# Patient Record
Sex: Female | Born: 1950 | Race: Black or African American | Hispanic: No | Marital: Married | Smoking: Never smoker
Health system: Southern US, Community
[De-identification: ages and names within clinical notes are randomized; demographics above are authoritative.]

## PROBLEM LIST (undated history)

## (undated) DIAGNOSIS — F329 Major depressive disorder, single episode, unspecified: Secondary | ICD-10-CM

## (undated) DIAGNOSIS — D649 Anemia, unspecified: Secondary | ICD-10-CM

## (undated) DIAGNOSIS — G4730 Sleep apnea, unspecified: Secondary | ICD-10-CM

## (undated) DIAGNOSIS — K219 Gastro-esophageal reflux disease without esophagitis: Secondary | ICD-10-CM

## (undated) DIAGNOSIS — R519 Headache, unspecified: Secondary | ICD-10-CM

## (undated) DIAGNOSIS — K760 Fatty (change of) liver, not elsewhere classified: Secondary | ICD-10-CM

## (undated) DIAGNOSIS — F32A Depression, unspecified: Secondary | ICD-10-CM

## (undated) HISTORY — DX: Major depressive disorder, single episode, unspecified: F32.9

## (undated) HISTORY — PX: ABDOMINAL HYSTERECTOMY: SHX81

## (undated) HISTORY — DX: Depression, unspecified: F32.A

---

## 1999-04-17 ENCOUNTER — Other Ambulatory Visit: Admission: RE | Admit: 1999-04-17 | Discharge: 1999-04-17 | Payer: Self-pay | Admitting: Gynecology

## 1999-06-03 ENCOUNTER — Encounter: Admission: RE | Admit: 1999-06-03 | Discharge: 1999-09-01 | Payer: Self-pay | Admitting: Internal Medicine

## 1999-08-18 ENCOUNTER — Emergency Department (HOSPITAL_COMMUNITY): Admission: EM | Admit: 1999-08-18 | Discharge: 1999-08-18 | Payer: Self-pay | Admitting: Internal Medicine

## 1999-10-14 ENCOUNTER — Encounter: Admission: RE | Admit: 1999-10-14 | Discharge: 2000-01-12 | Payer: Self-pay | Admitting: Internal Medicine

## 2000-04-20 ENCOUNTER — Other Ambulatory Visit: Admission: RE | Admit: 2000-04-20 | Discharge: 2000-04-20 | Payer: Self-pay | Admitting: Gynecology

## 2000-05-10 ENCOUNTER — Encounter: Admission: RE | Admit: 2000-05-10 | Discharge: 2000-08-08 | Payer: Self-pay | Admitting: Internal Medicine

## 2000-08-23 ENCOUNTER — Encounter: Payer: Self-pay | Admitting: Internal Medicine

## 2000-08-23 ENCOUNTER — Encounter: Admission: RE | Admit: 2000-08-23 | Discharge: 2000-11-21 | Payer: Self-pay | Admitting: Internal Medicine

## 2000-08-23 ENCOUNTER — Encounter: Admission: RE | Admit: 2000-08-23 | Discharge: 2000-08-23 | Payer: Self-pay | Admitting: Internal Medicine

## 2001-04-11 ENCOUNTER — Other Ambulatory Visit: Admission: RE | Admit: 2001-04-11 | Discharge: 2001-04-11 | Payer: Self-pay | Admitting: Gynecology

## 2001-08-30 ENCOUNTER — Encounter: Admission: RE | Admit: 2001-08-30 | Discharge: 2001-11-28 | Payer: Self-pay | Admitting: Internal Medicine

## 2001-11-30 ENCOUNTER — Encounter: Admission: RE | Admit: 2001-11-30 | Discharge: 2002-02-28 | Payer: Self-pay | Admitting: Internal Medicine

## 2002-04-24 ENCOUNTER — Other Ambulatory Visit: Admission: RE | Admit: 2002-04-24 | Discharge: 2002-04-24 | Payer: Self-pay | Admitting: Gynecology

## 2002-04-24 ENCOUNTER — Encounter: Admission: RE | Admit: 2002-04-24 | Discharge: 2002-07-23 | Payer: Self-pay | Admitting: Internal Medicine

## 2002-11-17 ENCOUNTER — Encounter: Admission: RE | Admit: 2002-11-17 | Discharge: 2003-02-15 | Payer: Self-pay | Admitting: Internal Medicine

## 2003-02-23 ENCOUNTER — Encounter: Admission: RE | Admit: 2003-02-23 | Discharge: 2003-05-24 | Payer: Self-pay | Admitting: Internal Medicine

## 2003-03-12 ENCOUNTER — Other Ambulatory Visit: Admission: RE | Admit: 2003-03-12 | Discharge: 2003-03-12 | Payer: Self-pay | Admitting: Gynecology

## 2003-03-19 ENCOUNTER — Encounter: Admission: RE | Admit: 2003-03-19 | Discharge: 2003-06-17 | Payer: Self-pay | Admitting: Internal Medicine

## 2004-03-11 ENCOUNTER — Encounter: Admission: RE | Admit: 2004-03-11 | Discharge: 2004-06-09 | Payer: Self-pay | Admitting: Internal Medicine

## 2004-03-31 ENCOUNTER — Other Ambulatory Visit: Admission: RE | Admit: 2004-03-31 | Discharge: 2004-03-31 | Payer: Self-pay | Admitting: Gynecology

## 2004-09-03 ENCOUNTER — Encounter: Admission: RE | Admit: 2004-09-03 | Discharge: 2004-09-03 | Payer: Self-pay | Admitting: Internal Medicine

## 2004-09-30 ENCOUNTER — Ambulatory Visit: Payer: Self-pay | Admitting: Gastroenterology

## 2004-10-07 ENCOUNTER — Encounter: Admission: RE | Admit: 2004-10-07 | Discharge: 2005-01-05 | Payer: Self-pay | Admitting: Internal Medicine

## 2005-01-09 ENCOUNTER — Encounter: Admission: RE | Admit: 2005-01-09 | Discharge: 2005-04-09 | Payer: Self-pay | Admitting: Internal Medicine

## 2005-04-13 ENCOUNTER — Other Ambulatory Visit: Admission: RE | Admit: 2005-04-13 | Discharge: 2005-04-13 | Payer: Self-pay | Admitting: Gynecology

## 2005-10-05 HISTORY — PX: KNEE SURGERY: SHX244

## 2005-11-03 ENCOUNTER — Other Ambulatory Visit: Admission: RE | Admit: 2005-11-03 | Discharge: 2005-11-03 | Payer: Self-pay | Admitting: Gynecology

## 2005-12-01 ENCOUNTER — Ambulatory Visit: Payer: Self-pay | Admitting: Internal Medicine

## 2006-03-19 ENCOUNTER — Ambulatory Visit: Payer: Self-pay | Admitting: Internal Medicine

## 2006-03-25 ENCOUNTER — Other Ambulatory Visit: Admission: RE | Admit: 2006-03-25 | Discharge: 2006-03-25 | Payer: Self-pay | Admitting: Gynecology

## 2006-04-03 ENCOUNTER — Ambulatory Visit: Payer: Self-pay | Admitting: Internal Medicine

## 2006-08-18 ENCOUNTER — Other Ambulatory Visit: Admission: RE | Admit: 2006-08-18 | Discharge: 2006-08-18 | Payer: Self-pay | Admitting: Gynecology

## 2006-09-20 ENCOUNTER — Ambulatory Visit: Payer: Self-pay | Admitting: Internal Medicine

## 2006-10-28 ENCOUNTER — Ambulatory Visit: Payer: Self-pay | Admitting: Unknown Physician Specialty

## 2006-11-15 ENCOUNTER — Ambulatory Visit: Payer: Self-pay | Admitting: Unknown Physician Specialty

## 2006-11-22 ENCOUNTER — Ambulatory Visit: Payer: Self-pay | Admitting: Internal Medicine

## 2007-01-04 ENCOUNTER — Other Ambulatory Visit: Admission: RE | Admit: 2007-01-04 | Discharge: 2007-01-04 | Payer: Self-pay | Admitting: Gynecology

## 2007-08-10 ENCOUNTER — Other Ambulatory Visit: Admission: RE | Admit: 2007-08-10 | Discharge: 2007-08-10 | Payer: Self-pay | Admitting: Gynecology

## 2007-10-24 ENCOUNTER — Ambulatory Visit: Payer: Self-pay | Admitting: Gastroenterology

## 2008-04-26 ENCOUNTER — Ambulatory Visit: Payer: Self-pay | Admitting: Gastroenterology

## 2008-05-08 ENCOUNTER — Ambulatory Visit: Payer: Self-pay | Admitting: Gastroenterology

## 2008-09-26 ENCOUNTER — Emergency Department: Payer: Self-pay | Admitting: Emergency Medicine

## 2008-10-26 ENCOUNTER — Other Ambulatory Visit: Admission: RE | Admit: 2008-10-26 | Discharge: 2008-10-26 | Payer: Self-pay | Admitting: Gynecology

## 2010-01-21 ENCOUNTER — Ambulatory Visit: Payer: Self-pay | Admitting: Internal Medicine

## 2010-10-27 ENCOUNTER — Ambulatory Visit: Payer: Self-pay | Admitting: Gastroenterology

## 2011-01-29 ENCOUNTER — Ambulatory Visit: Payer: Self-pay | Admitting: Gastroenterology

## 2011-03-27 ENCOUNTER — Emergency Department (HOSPITAL_COMMUNITY)
Admission: EM | Admit: 2011-03-27 | Discharge: 2011-03-27 | Disposition: A | Discharge status: Home / Self Care | Payer: Worker's Compensation | Attending: Emergency Medicine | Admitting: Emergency Medicine

## 2011-03-27 ENCOUNTER — Emergency Department (HOSPITAL_COMMUNITY): Payer: Worker's Compensation

## 2011-03-27 DIAGNOSIS — F329 Major depressive disorder, single episode, unspecified: Secondary | ICD-10-CM | POA: Insufficient documentation

## 2011-03-27 DIAGNOSIS — E119 Type 2 diabetes mellitus without complications: Secondary | ICD-10-CM | POA: Insufficient documentation

## 2011-03-27 DIAGNOSIS — Z79899 Other long term (current) drug therapy: Secondary | ICD-10-CM | POA: Insufficient documentation

## 2011-03-27 DIAGNOSIS — M542 Cervicalgia: Secondary | ICD-10-CM | POA: Insufficient documentation

## 2011-03-27 DIAGNOSIS — S139XXA Sprain of joints and ligaments of unspecified parts of neck, initial encounter: Secondary | ICD-10-CM | POA: Insufficient documentation

## 2011-03-27 DIAGNOSIS — W108XXA Fall (on) (from) other stairs and steps, initial encounter: Secondary | ICD-10-CM | POA: Insufficient documentation

## 2011-03-27 DIAGNOSIS — F3289 Other specified depressive episodes: Secondary | ICD-10-CM | POA: Insufficient documentation

## 2011-03-27 DIAGNOSIS — K219 Gastro-esophageal reflux disease without esophagitis: Secondary | ICD-10-CM | POA: Insufficient documentation

## 2011-04-14 ENCOUNTER — Other Ambulatory Visit: Payer: Self-pay | Admitting: Neurological Surgery

## 2011-04-14 DIAGNOSIS — R200 Anesthesia of skin: Secondary | ICD-10-CM

## 2011-04-16 ENCOUNTER — Ambulatory Visit
Admission: RE | Admit: 2011-04-16 | Discharge: 2011-04-16 | Disposition: A | Discharge status: Home / Self Care | Payer: Worker's Compensation | Source: Ambulatory Visit | Attending: Neurological Surgery | Admitting: Neurological Surgery

## 2011-04-16 DIAGNOSIS — R200 Anesthesia of skin: Secondary | ICD-10-CM

## 2011-04-23 ENCOUNTER — Ambulatory Visit (HOSPITAL_COMMUNITY)
Admission: RE | Admit: 2011-04-23 | Discharge: 2011-04-23 | Disposition: A | Discharge status: Home / Self Care | Payer: Worker's Compensation | Source: Ambulatory Visit | Attending: Neurology | Admitting: Neurology

## 2011-04-23 DIAGNOSIS — Z1389 Encounter for screening for other disorder: Secondary | ICD-10-CM | POA: Insufficient documentation

## 2011-04-23 DIAGNOSIS — R51 Headache: Secondary | ICD-10-CM | POA: Insufficient documentation

## 2011-04-23 NOTE — Procedures (Signed)
HISTORY:  A 60 year old female, status post fall with head injury now with complaints of headache.  MEDICATIONS:  Metformin, valacyclovir, citalopram, glipizide, bupropion and ketoprofen.  CONDITIONS OF RECORDING:  This is a 16-channel EEG carried out with patient in the awake state.  DESCRIPTION:  The waking background activity consists of a low-voltage symmetrical fairly well-organized 9 Hz alpha activity seen from the parieto-occipital posterotemporal regions.  Low-voltage fast activity poorly organized was seen and during at times superimposed on more posterior rhythms.  A mixture of theta and beta was seen from the central and temporal regions.  The patient does not drowse asleep. Hypoventilation was performed and produced a mild build up, but failed to elicit any abnormalities.  Intermittent photic stimulation was performed and this is symmetrical driving response, but failed to elicit any abnormalities.  IMPRESSION:  This is a normal awake EEG.  COMMENT:  An EEG with the patient is sleep deprived to elicit drowse and light sleep maybe desirable to further elicit a possible seizure disorder.          ______________________________ Thana Farr, MD    BJ:YNWG D:  04/23/2011 17:48:36  T:  04/23/2011 23:34:49  Job #:  [id number]

## 2011-05-06 ENCOUNTER — Other Ambulatory Visit: Payer: Self-pay | Admitting: Gynecology

## 2011-05-06 DIAGNOSIS — R928 Other abnormal and inconclusive findings on diagnostic imaging of breast: Secondary | ICD-10-CM

## 2011-05-13 ENCOUNTER — Ambulatory Visit
Admission: RE | Admit: 2011-05-13 | Discharge: 2011-05-13 | Disposition: A | Discharge status: Home / Self Care | Payer: Worker's Compensation | Source: Ambulatory Visit | Attending: Gynecology | Admitting: Gynecology

## 2011-05-13 DIAGNOSIS — R928 Other abnormal and inconclusive findings on diagnostic imaging of breast: Secondary | ICD-10-CM

## 2011-11-12 ENCOUNTER — Ambulatory Visit (INDEPENDENT_AMBULATORY_CARE_PROVIDER_SITE_OTHER): Payer: 59 | Admitting: Family Medicine

## 2011-11-12 ENCOUNTER — Ambulatory Visit: Payer: 59

## 2011-11-12 DIAGNOSIS — M25569 Pain in unspecified knee: Secondary | ICD-10-CM

## 2011-11-12 DIAGNOSIS — E669 Obesity, unspecified: Secondary | ICD-10-CM

## 2011-11-12 DIAGNOSIS — E119 Type 2 diabetes mellitus without complications: Secondary | ICD-10-CM | POA: Insufficient documentation

## 2011-11-12 NOTE — Progress Notes (Signed)
  Patient Name: Jasmine Robinson Date of Birth: 1951/01/23 Medical Record Number: [id number] Gender: female Date of Encounter: 11/12/2011  History of Present Illness:  Jasmine Robinson is a 61 y.o. very pleasant female patient who presents with the following:  History of diabetes, depression and anxiety.  Had a concussion last year which had long lasting effects.  She is much better now but still follows-up with Dr. Vela Prose.  Also fell last year and hurt her right knee- she was noted to have a bone island on plain film and 6 month follow- up was recommended.  She is here to have this done today. Knee hurts "like a toothache."  Hurts most when she gets up from rest.  Has gotten a lot worse the last couple of weeks but no known re-injury.  Has had a meniscal tear repair in her left knee per [facility] in [place] in aprox 2007.    Right knee has not swollen. No clicking of popping but does "get stuck."  No instability.    Patient Active Problem List  Diagnoses  . Diabetes mellitus  . Obesity   Past Medical History  Diagnosis Date  . Diabetes mellitus   . Depression    Past Surgical History  Procedure Date  . Abdominal hysterectomy   . Knee surgery 2007    torn meniscus   History  Substance Use Topics  . Smoking status: Never Smoker   . Smokeless tobacco: Never Used  . Alcohol Use: Yes     occasionally   Family History  Problem Relation Age of Onset  . Diabetes Father    No Known Allergies  Medication list has been reviewed and updated.  Review of Systems: As per HPI, otherwise negative  Physical Examination: Filed Vitals:   11/12/11 1854  BP: 154/70  Pulse: 112  Temp: 98.6 F (37 C)  TempSrc: Oral  Resp: 18  Height: 5' 6.25" (1.683 m)  Weight: 222 lb 9.6 oz (100.971 kg)    Body mass index is 35.66 kg/(m^2).  GEN: WDWN, NAD, Non-toxic, A & O x 3, obese HEENT: Atraumatic, Normocephalic. Neck supple. No masses, No LAD. Ears and Nose: No external  deformity. CV: RRR, No M/G/R. No JVD. No thrill. No extra heart sounds. PULM: CTA B, no wheezes, crackles, rhonchi. No retractions. No resp. distress. No accessory muscle use. Rt knee: plus crepitus, tender at medical joint line.  Negative anterior drawer.  No swelling, redness or heat.   EXTR: No c/c/e NEURO Normal gait.  PSYCH: Normally interactive. Conversant. Not depressed or anxious appearing.  Calm demeanor.   [facility] reading (PRIMARY) by  Dr. Patsy Lager.  Knee negative for fracture- bone islands in tibia, mild to moderate degenerative change and spurring  Assessment and Plan: 61 year old female with knee pain- likely due to degenerative changes.  She will call and schedule a visit at [facility].  She will continue to use aleve prn for her knee pain.  Also continue regular follow- up of her diabetes per her PCP.  Let us know if we can help in any way, await formal over-read of her film

## 2011-11-13 ENCOUNTER — Other Ambulatory Visit: Payer: Self-pay | Admitting: Family Medicine

## 2011-11-16 ENCOUNTER — Telehealth: Payer: Self-pay | Admitting: Family Medicine

## 2011-11-16 NOTE — Telephone Encounter (Signed)
Message copied by Kerney Elbe on Mon Nov 16, 2011  4:33 PM ------      Message from: Abbe Amsterdam C      Created: Mon Nov 16, 2011  3:53 PM       Please pull chart for me!  Thanks      JC            Need to ask rads to compare with old films re: bone [place]

## 2011-11-18 ENCOUNTER — Encounter: Payer: Self-pay | Admitting: Family Medicine

## 2011-12-01 DIAGNOSIS — F4323 Adjustment disorder with mixed anxiety and depressed mood: Secondary | ICD-10-CM

## 2012-05-03 ENCOUNTER — Observation Stay: Payer: Self-pay | Admitting: Internal Medicine

## 2012-05-03 LAB — TSH: Thyroid Stimulating Horm: 0.652 u[IU]/mL

## 2012-05-03 LAB — BASIC METABOLIC PANEL
BUN: 18 mg/dL (ref 7–18)
Creatinine: 0.93 mg/dL (ref 0.60–1.30)
EGFR (African American): >60
EGFR (Non-African Amer.): >60
Glucose: 166 mg/dL — ABNORMAL HIGH (ref 65–99)
Osmolality: 281 (ref 275–301)
Sodium: 138 mmol/L (ref 136–145)

## 2012-05-03 LAB — HEPATIC FUNCTION PANEL A (ARMC)
Bilirubin, Direct: <0.1 mg/dL (ref 0.00–0.20)
Bilirubin,Total: 0.2 mg/dL (ref 0.2–1.0)
Total Protein: 7.7 g/dL (ref 6.4–8.2)

## 2012-05-03 LAB — CBC
HGB: 12.4 g/dL (ref 12.0–16.0)
MCH: 28.7 pg (ref 26.0–34.0)
MCHC: 33.4 g/dL (ref 32.0–36.0)
MCV: 86 fL (ref 80–100)
RBC: 4.33 10*6/uL (ref 3.80–5.20)

## 2012-05-03 LAB — DIFFERENTIAL
Basophil %: 0.4 %
Eosinophil %: 0.1 %
Lymphocyte #: 1.1 10*3/uL (ref 1.0–3.6)
Monocyte #: 0.1 x10 3/mm — ABNORMAL LOW (ref 0.2–0.9)
Monocyte %: 1 %
Neutrophil #: 8.5 10*3/uL — ABNORMAL HIGH (ref 1.4–6.5)

## 2012-06-17 ENCOUNTER — Ambulatory Visit: Payer: Self-pay | Admitting: Internal Medicine

## 2012-06-21 ENCOUNTER — Ambulatory Visit: Payer: Self-pay | Admitting: Internal Medicine

## 2012-07-05 ENCOUNTER — Ambulatory Visit: Payer: Self-pay | Admitting: Internal Medicine

## 2012-07-07 ENCOUNTER — Ambulatory Visit: Payer: Self-pay | Admitting: Internal Medicine

## 2012-08-05 ENCOUNTER — Ambulatory Visit: Payer: Self-pay | Admitting: Internal Medicine

## 2013-05-23 ENCOUNTER — Ambulatory Visit: Payer: Self-pay | Admitting: Internal Medicine

## 2013-06-21 ENCOUNTER — Ambulatory Visit: Payer: Self-pay | Admitting: Internal Medicine

## 2013-07-03 ENCOUNTER — Ambulatory Visit: Payer: Self-pay | Admitting: Internal Medicine

## 2013-07-03 HISTORY — PX: BREAST BIOPSY: SHX20

## 2013-09-20 ENCOUNTER — Ambulatory Visit: Payer: Self-pay | Admitting: Oncology

## 2013-10-26 ENCOUNTER — Emergency Department: Payer: Self-pay | Admitting: Emergency Medicine

## 2013-10-26 LAB — URINALYSIS, COMPLETE
BLOOD: NEGATIVE
Bilirubin,UR: NEGATIVE
Hyaline Cast: 4
Ketone: NEGATIVE
NITRITE: NEGATIVE
PROTEIN: NEGATIVE
Ph: 5 (ref 4.5–8.0)
RBC,UR: 5 /HPF (ref 0–5)
Specific Gravity: 1.029 (ref 1.003–1.030)
Squamous Epithelial: 5

## 2013-10-26 LAB — CBC WITH DIFFERENTIAL/PLATELET
Basophil #: 0.1 10*3/uL (ref 0.0–0.1)
Basophil %: 0.7 %
EOS PCT: 2.5 %
Eosinophil #: 0.2 10*3/uL (ref 0.0–0.7)
HCT: 39.5 % (ref 35.0–47.0)
HGB: 12.8 g/dL (ref 12.0–16.0)
Lymphocyte #: 2 10*3/uL (ref 1.0–3.6)
Lymphocyte %: 23.2 %
MCH: 28.3 pg (ref 26.0–34.0)
MCHC: 32.5 g/dL (ref 32.0–36.0)
MCV: 87 fL (ref 80–100)
Monocyte #: 0.4 x10 3/mm (ref 0.2–0.9)
Monocyte %: 4.5 %
NEUTROS ABS: 6.1 10*3/uL (ref 1.4–6.5)
NEUTROS PCT: 69.1 %
PLATELETS: 210 10*3/uL (ref 150–440)
RBC: 4.53 10*6/uL (ref 3.80–5.20)
RDW: 16 % — ABNORMAL HIGH (ref 11.5–14.5)
WBC: 8.8 10*3/uL (ref 3.6–11.0)

## 2013-10-26 LAB — COMPREHENSIVE METABOLIC PANEL
ANION GAP: 3 — AB (ref 7–16)
Albumin: 3.9 g/dL (ref 3.4–5.0)
Alkaline Phosphatase: 140 U/L — ABNORMAL HIGH
BUN: 18 mg/dL (ref 7–18)
Bilirubin,Total: 0.2 mg/dL (ref 0.2–1.0)
CALCIUM: 9.9 mg/dL (ref 8.5–10.1)
CREATININE: 0.82 mg/dL (ref 0.60–1.30)
Chloride: 107 mmol/L (ref 98–107)
Co2: 29 mmol/L (ref 21–32)
EGFR (African American): >60
GLUCOSE: 134 mg/dL — AB (ref 65–99)
Osmolality: 281 (ref 275–301)
Potassium: 4.4 mmol/L (ref 3.5–5.1)
SGOT(AST): 30 U/L (ref 15–37)
SGPT (ALT): 52 U/L (ref 12–78)
SODIUM: 139 mmol/L (ref 136–145)
Total Protein: 8.1 g/dL (ref 6.4–8.2)

## 2013-10-26 LAB — TROPONIN I: Troponin-I: <0.02 ng/mL

## 2013-10-27 LAB — RAPID INFLUENZA A&B ANTIGENS

## 2013-10-27 LAB — LIPASE, BLOOD: Lipase: 339 U/L (ref 73–393)

## 2013-10-30 ENCOUNTER — Other Ambulatory Visit: Payer: Self-pay | Admitting: Ophthalmology

## 2013-10-30 LAB — CBC WITH DIFFERENTIAL/PLATELET
BASOS PCT: 0.7 %
Basophil #: 0.1 10*3/uL (ref 0.0–0.1)
Eosinophil #: 0.3 10*3/uL (ref 0.0–0.7)
Eosinophil %: 2.6 %
HCT: 39.5 % (ref 35.0–47.0)
HGB: 12.7 g/dL (ref 12.0–16.0)
LYMPHS ABS: 3.1 10*3/uL (ref 1.0–3.6)
Lymphocyte %: 31.3 %
MCH: 27.9 pg (ref 26.0–34.0)
MCHC: 32.2 g/dL (ref 32.0–36.0)
MCV: 87 fL (ref 80–100)
MONO ABS: 0.6 x10 3/mm (ref 0.2–0.9)
MONOS PCT: 5.8 %
NEUTROS ABS: 5.9 10*3/uL (ref 1.4–6.5)
NEUTROS PCT: 59.6 %
PLATELETS: 220 10*3/uL (ref 150–440)
RBC: 4.55 10*6/uL (ref 3.80–5.20)
RDW: 15.6 % — ABNORMAL HIGH (ref 11.5–14.5)
WBC: 9.9 10*3/uL (ref 3.6–11.0)

## 2013-10-30 LAB — SEDIMENTATION RATE: ERYTHROCYTE SED RATE: 45 mm/h — AB (ref 0–30)

## 2013-11-27 ENCOUNTER — Ambulatory Visit: Payer: Self-pay | Admitting: Internal Medicine

## 2014-01-11 ENCOUNTER — Ambulatory Visit: Payer: Self-pay | Admitting: Gastroenterology

## 2015-01-10 ENCOUNTER — Ambulatory Visit
Admit: 2015-01-10 | Disposition: A | Discharge status: Home / Self Care | Payer: Self-pay | Attending: Gastroenterology | Admitting: Gastroenterology

## 2015-01-22 NOTE — H&P (Signed)
PATIENT NAME:  Jasmine Robinson, Jasmine Robinson MR#:  [id number] DATE OF BIRTH:  08/16/1951  DATE OF ADMISSION:  05/03/2012  PRIMARY CARE PHYSICIAN: Tracie Harrier, MD  CHIEF COMPLAINT: Tongue and throat swelling.   HISTORY OF PRESENT ILLNESS: This is a 64 year old African American female with chronic medical conditions of diabetes mellitus, obstructive sleep apnea, and arthritis who presents with sudden onset of throat and tongue swelling. The patient notes that she was in her usual state of health, had peanut butter and crackers prior to going to bed, and then subsequently was awoken from sleep due to tongue swelling. She notes that upon waking she had tongue swelling and difficulty swallowing and speaking and otherwise can identify the precipitating cause. She has had peanut butter and crackers before without any issues. She denies any medications, but takes aspirin daily in the morning. The patient is not on lisinopril.   She says she has an allergy to shellfish and has no seasonal allergies nor any drug allergies. She has never had these kind of symptoms before.   PAST MEDICAL HISTORY: 1. Obstructive sleep apnea. 2. Diabetes mellitus. 3. Depression/anxiety. 4. Arthritis. 5. Genital herpes. 6. Gastritis noted on EGD in April 2012.  7. History of non-thrombosed external hemorrhoids on a colonoscopy in January of 2012.   PAST SURGICAL HISTORY:  1. Hysterectomy. 2. Left meniscal tear repair.   ALLERGIES: No known drug allergies. No seasonal allergies.     MEDICATIONS:  1. Aspirin 81 mg daily. 2. Celexa 10 mg daily.  3. Glipizide 10 mg twice a day. 4. Metformin 500 mg twice a day. 5. Valtrex 1 gram daily. 6. Wellbutrin XL 300 mg daily.   FAMILY HISTORY: Notable for diabetes.   SOCIAL HISTORY: Married for 25 years. Denies tobacco or illicit drug use. She is a social drinker "drinks every now and then". 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   PHYSICAL EXAMINATION:    VITALS: Heart rate 114, respirations 22, systolic blood pressure 737/10, and saturating 96% on room air.  GENERAL: Obese, African American female in no apparent distress lying comfortably on the gurney. She is in no respiratory distress. She is resting calmly, speaking in full sentences.   HEENT: Initially on the ED evaluation, she had significant tongue swelling without any posterior oropharynx swelling and muffled voice. At the time of my evaluation, she is able to speak in full sentences. There is tongue swelling with dental indentations appreciated but it is decreased. Mild submental edema. Extraocular muscles are intact. Anicteric sclera. Normal external ears and nose. No other oral lesions appreciated.   CARDIAC: Normal S1 and S2, regular rate and rhythm. No murmurs.   LUNGS: Chest clear to auscultation bilaterally.   EXTREMITIES: No clubbing, cyanosis, or edema.   ABDOMEN: Soft, nontender, and nondistended with normal bowel sounds.   NEURO: She is awake, alert, and oriented x3. No motor sensory deficits. Normal mood.  SKIN: No rashes   LABORATORY, DIAGNOSTIC AND RADIOLOGIC DATA: BMP shows glucose of 166, BUN 18, creatinine 0.93, sodium 138, potassium 4.2, chloride 105, bicarbonate 24, calcium 9.3, anion gap 9, and osmolality 281.   WBC count is increased to 12.4, hemoglobin is 12.4, and hematocrit is 37.2 with a platelet count of 198. There is no differential at this time.   EKG shows sinus tachycardia at 106 beats per minute with poor R wave progression in the precordial leads and probable P mitrale which may indicate left atrial enlargement, otherwise no ST or T wave  abnormalities.   Chest x-ray shows no evidence of congestive heart failure, no definite pneumonia. Mildly increased density in the right infrahilar region which may reflect atelectasis.   ASSESSMENT AND PLAN: A 64 year old female presenting with angioedema of unknown etiology.  1. Angioedema. This may be  idiopathic versus heriditary even though there is no prior history of this. At this time, we will check LFTs, ESR, CRP, and C1 esterase inhibitor levels as well as C3 and C4 complement levels. We will continue methylprednisolone. She has received methylprednisolone, Zantac, subcutaneous epinephrine, and Benadryl while in the emergency department with symptom improvement. We will monitor her airway. If she does have C1 esterase inhibitor deficiency, we will consult an allergist for replacement. Otherwise, if studies are negative and she improves clinically, we will likely recommend outpatient allergist followup. Given acute allergy reaction, we will hold off on aspirin for now. 2. Diabetes mellitus. Continue Accu-Cheks and metformin and glipizide.  3. Depression and anxiety. We will continue her Wellbutrin and Celexa.  4. Genital herpes. We will continue her Valtrex.   CODE STATUS: FULL  DISPOSITION: Admit for  airway monitoring given acute angioedema.   Thank you for involving [place] in the care of this patient.   TIME SPENT: 40 min ____________________________ Samson Frederic, DO aeo:slb D: 05/03/2012 08:20:56 ET T: 05/03/2012 08:51:07 ET JOB#: [id number]  cc: Samson Frederic, DO, <Dictator> Tracie Harrier, MD Kaniesha Barile E Ajane Novella DO ELECTRONICALLY SIGNED 05/04/2012 6:42

## 2015-01-22 NOTE — Discharge Summary (Signed)
PATIENT NAME:  Jasmine Robinson, Jasmine Robinson MR#:  [id number] DATE OF BIRTH:  1951-06-07  DATE OF ADMISSION:  05/03/2012 DATE OF DISCHARGE:  05/04/2012  DISCHARGE DIAGNOSES:  1. Angioedema of unclear etiology.  2. Type 2 diabetes.  3. Depression and anxiety.  4. History of genital herpes.   CHIEF COMPLAINT: Swelling of throat and tongue.   HISTORY OF PRESENT ILLNESS: Jasmine Robinson is a 64 year old female with a history of type 2 diabetes, obstructive sleep apnea, and osteoarthritis who presents complaining of sudden onset of throat and tongue swelling. The patient states that she had some peanut butter and crackers prior to going to bed and woke up from sleep because of difficulty swallowing and speaking and also her tongue swelling up. The patient has had no previous such episodes. She is not on any ACE inhibitors and states she has had no problems with peanut butter and crackers in the past.   ALLERGIES: She is allergic to shellfish but no other seasonal allergies.   PAST MEDICAL HISTORY:  1. Type 2 diabetes. 2. Depression and anxiety.  3. Osteoarthritis.  4. Genital herpes.  5. Obstructive sleep apnea.   PAST SURGICAL HISTORY:  1. Hysterectomy.  2. Left meniscus tear repair.   PHYSICAL EXAMINATION: On examination heart rate was 114, respirations 22, blood pressure 159/87, and oxygen saturation 96% on room air. She was not in distress, speaking in full sentences. There was evidence for her tongue swelling with indentation noted from her teeth. There was also submental edema. Extraocular movements were intact. Sclerae were anicteric. Heart S1 and S2. Lungs were clear. Abdomen was soft and nontender. Extremities had no edema. Neurologically she was nonfocal.  LABS/STUDIES: TSH was 0.652. Glucose 166, BUN 18, creatinine 0.93, sodium 138, potassium 4.2, chloride 105, and CO2 24. Hemoglobin was 12.4, WBC count 12.4, and hematocrit 37.2. Sedimentation rate was 47.   Chest x-ray showed no evidence of  congestive heart failure or pneumonia. A mild increased density in the right infrahilar region was noted which was presumed to be atelectasis.   EKG showed sinus tachycardia with no acute changes.   C-reactive protein was 17.1, normal range 0 to 4.9. C1 esterase inhibitor was 35, normal 21 to 39. C4 complement was elevated at 56, normal range is 9 to 36. C3 complement was elevated at 180, normal range is 90 to 180.  HOSPITAL COURSE: During her stay in the hospital, the patient received intravenous steroids and Zantac and Benadryl. She was continued on her oral hypoglycemic agent and other medicines for depression and anxiety. She significantly improved and she was discharged in stable condition on the following medications.   DISCHARGE MEDICATIONS:  1. Metformin 500 mg p.o. twice a day.   2. Glipizide 10 mg p.o. twice a day.  3. Valtrex 1 gram daily.  4. Wellbutrin XL 300 mg daily.  5. Celexa 10 mg a day.  6. Aspirin 81 mg a day.  7. Prednisone 30 mg a day for three days, decrease x 10 mg every three days until gone.  8. Prescription for an EpiPen auto injector was also given to use on a p.r.n. basis.           DISCHARGE INSTRUCTIONS: The patient has been advised to follow up in the clinic in 1 to 2 weeks and we will consider referral to an allergist if symptoms recur.    Time spent on discahrage: 35 minutes  ____________________________ Tracie Harrier, MD vh:slb D: 05/13/2012 17:45:01 ET T: 05/14/2012 15:51:36 ET JOB#: [id number]  cc: Tracie Harrier, MD, <Dictator> Tracie Harrier MD ELECTRONICALLY SIGNED 05/30/2012 17:21

## 2015-05-08 DIAGNOSIS — D649 Anemia, unspecified: Secondary | ICD-10-CM | POA: Insufficient documentation

## 2015-05-27 ENCOUNTER — Inpatient Hospital Stay: Payer: Medicare Other

## 2015-05-27 ENCOUNTER — Encounter (INDEPENDENT_AMBULATORY_CARE_PROVIDER_SITE_OTHER): Payer: Self-pay

## 2015-05-27 ENCOUNTER — Inpatient Hospital Stay: Payer: Medicare Other | Attending: Internal Medicine | Admitting: Internal Medicine

## 2015-05-27 VITALS — BP 121/78 | HR 106 | Temp 96.9°F | Ht 66.25 in | Wt 211.9 lb

## 2015-05-27 DIAGNOSIS — F329 Major depressive disorder, single episode, unspecified: Secondary | ICD-10-CM | POA: Insufficient documentation

## 2015-05-27 DIAGNOSIS — D649 Anemia, unspecified: Secondary | ICD-10-CM | POA: Insufficient documentation

## 2015-05-27 DIAGNOSIS — Z79899 Other long term (current) drug therapy: Secondary | ICD-10-CM | POA: Diagnosis not present

## 2015-05-27 DIAGNOSIS — E119 Type 2 diabetes mellitus without complications: Secondary | ICD-10-CM | POA: Diagnosis not present

## 2015-05-27 LAB — IRON AND TIBC
IRON: 48 ug/dL (ref 28–170)
Saturation Ratios: 13 % (ref 10.4–31.8)
TIBC: 371 ug/dL (ref 250–450)
UIBC: 323 ug/dL

## 2015-05-27 LAB — FERRITIN: Ferritin: 89 ng/mL (ref 11–307)

## 2015-05-27 LAB — DAT, POLYSPECIFIC AHG (ARMC ONLY): POLYSPECIFIC AHG TEST: NEGATIVE

## 2015-05-27 LAB — LACTATE DEHYDROGENASE: LDH: 143 U/L (ref 98–192)

## 2015-05-27 NOTE — Progress Notes (Signed)
[place]  [phone] [phone]     ID: Jasmine Robinson OB: 1950/11/24  MR#: [id number]  TFT#:[id number]  Patient Care Team: Tracie Harrier, MD as PCP - General (Internal Medicine)  CHIEF COMPLAINT/DIAGNOSIS:  Persistent Normocytic Anemia of unclear etiology -   patient referred here for Hematology evaluation.  (labs done on 05/02/15 showed hemoglobin 11.3,MCV 9, WBC 7.5, unremarkable differential, platelets 208 ,creatinine 0.8, LFTs unremarkable except alkaline phosphatase 110 , calcium 9.6 , TSH normal at 2.953, ferritin normal at 66, folate level normal at 8.8, B12 normal at 1431).   HISTORY OF PRESENT ILLNESS:  Jasmine Robinson is a 64 year old female with past medical history as described below. Patient had recent labs done by Dr.Hande on 05/02/15 which showed hemoglobin 11.3,MCV 9, WBC 7.5, unremarkable differential, platelets 208 ,creatinine 0.8, LFTs unremarkable except alkaline phosphatase 110 , calcium 9.6 , TSH normal at 2.953, ferritin normal at 66, folate level normal at 8.8, B12 normal at 1431. Previously hemoglobin was 11.9 done 4 months ago and 12.3 done 7 months ago. Patient states that she has noticed chronic fatigue on exertion, she does remain physically active. States that she is retired but goes to [employer] and does treadmill fairly regularly. She denies any new dyspnea, chest pain, palpitation, dizziness, orthopnea or PND. She denies any obvious bleeding symptoms including bright red blood in stools, melena or hematuria. She is status post hysterectomy in the past. No new bone pains otherwise.   REVIEW OF SYSTEMS:   ROS CONSTITUTIONAL: As in HPI above. No chills, fever or sweats.    ENT:  No headache, dizziness or epistaxis. No ear or jaw pain. No sinus symptoms. RESPIRATORY:   No cough.  No shortness of breath. No wheezing. No hemoptysis. CARDIAC:  No palpitations.  No retrosternal chest pain. No orthopnea, PND. GI:  No  abdominal pain, nausea or vomiting. No diarrhea.   GU:  No dysuria or hematuria.  SKIN: No rashes or pruritus. HEMATOLOGIC: Easy minor skin bruising on pressure/trauma. Otherwise denies bleeding symptoms MUSCULOSKELETAL:  No new bone pains.  EXTREMITY:  No new swelling or pain.  NEURO: History of head injury. Denies focal weakness. No numbness or tingling of extremities.  No seizures.   ENDOCRINE:  No polyuria or polydipsia.   PAST MEDICAL HISTORY: Reviewed. Past Medical History  Diagnosis Date  . Diabetes mellitus   . Depression     PAST SURGICAL HISTORY: Reviewed. Past Surgical History  Procedure Laterality Date  . Abdominal hysterectomy    . Knee surgery  2007    torn meniscus    FAMILY HISTORY: Reviewed. Family History  Problem Relation Age of Onset  . Diabetes Father     SOCIAL HISTORY: Reviewed. Social History  Substance Use Topics  . Smoking status: Never Smoker   . Smokeless tobacco: Never Used  . Alcohol Use: Yes     Comment: occasionally    No Known Allergies  Current Outpatient Prescriptions  Medication Sig Dispense Refill  . aspirin EC 81 MG tablet Take 81 mg by mouth daily.    Marland Kitchen buPROPion (WELLBUTRIN XL) 150 MG 24 hr tablet Take 150 mg by mouth daily.    . Citalopram Hydrobromide (CELEXA PO) Take by mouth.    . Ferrous Sulfate (IRON) 325 (65 FE) MG TABS Take 1 tablet by mouth daily.    Marland Kitchen glipiZIDE (GLIPIZIDE XL) 5 MG 24 hr tablet Take 10 mg by mouth 2 (two) times daily.    . metFORMIN (GLUCOPHAGE)  500 MG tablet Take 500 mg by mouth 2 (two) times daily with a meal.    . ValACYclovir HCl (VALTREX PO) Take by mouth.    . vitamin B-12 (CYANOCOBALAMIN) 1000 MCG tablet Take 1,000 mcg by mouth daily.     No current facility-administered medications for this visit.    PHYSICAL EXAM: Filed Vitals:   05/27/15 1447  BP: 121/78  Pulse: 106  Temp: 96.9 F (36.1 C)     Body mass index is 33.93 kg/(m^2).      GENERAL: Patient is alert and oriented and in  no acute distress. No icterus or pallor. HEENT: EOMs intact. No cervical lymphadenopathy. CVS: S1S2, regular LUNGS: Bilaterally clear to auscultation, no rhonchi. ABDOMEN: Soft, nontender. No hepatosplenomegaly clinically.  NEURO: grossly nonfocal, cranial nerves are intact.  EXTREMITIES: No pedal edema. LYMPHATICS: No palpable adenopathy in axillary or inguinal areas. SKIN: no major bruising or rash.   LAB RESULTS:    Component Value Date/Time   NA 139 10/26/2013 1514   K 4.4 10/26/2013 1514   CL 107 10/26/2013 1514   CO2 29 10/26/2013 1514   GLUCOSE 134* 10/26/2013 1514   BUN 18 10/26/2013 1514   CREATININE 0.82 10/26/2013 1514   CALCIUM 9.9 10/26/2013 1514   PROT 8.1 10/26/2013 1514   ALBUMIN 3.9 10/26/2013 1514   AST 30 10/26/2013 1514   ALT 52 10/26/2013 1514   ALKPHOS 140* 10/26/2013 1514   BILITOT 0.2 10/26/2013 1514   GFRNONAA >60 10/26/2013 1514   GFRAA >60 10/26/2013 1514    Lab Results  Component Value Date   WBC 9.9 10/30/2013   NEUTROABS 5.9 10/30/2013   HGB 12.7 10/30/2013   HCT 39.5 10/30/2013   MCV 87 10/30/2013   PLT 220 10/30/2013     ASSESSMENT / PLAN:   Persistent Normocytic Anemia of unclear etiology  (labs done on 05/02/15 showed hemoglobin 11.3,MCV 9, WBC 7.5, unremarkable differential, platelets 208 ,creatinine 0.8, LFTs unremarkable except alkaline phosphatase 110 , calcium 9.6 , TSH normal at 2.953, ferritin normal at 66, folate level normal at 8.8, B12 normal at 1431)   -   patient referred here for Hematology evaluation. Reviewed records and labs sent by referring physician and discussed with patient. She has mild normocytic anemia. Hemoglobin has gradually drifted from 12.3-11.3 over a period of 6-7 months. She does not have any obvious bleeding symptoms. Recent ferritin, B12 and folate are unremarkable. Plan is to get further anemia workup to look for other etiologies, will get CBC with differential, reticulocyte count, iron studies including  iron/TIBC/ferritin, LDH/haptoglobin/DAT test to look for evidence of hemolytic anemia, SPEP and kappa/lambda light chains, serum erythropoietin level. Will see her back in 1 week and make further recommendations based upon results of workup, including decide if we would need to pursue bone marrow biopsy evaluation versus continued monitoring, since one possibility could be that this is anemia of chronic disease.     In between visits, the patient has been advised to call or come to the ER in case of fevers, chills, bleeding, acute sickness, or new symptoms. Patient is agreeable to this plan.   Leia Alf, MD   05/27/2015 3:42 PM

## 2015-05-27 NOTE — Progress Notes (Signed)
Patient is referred here by Dr. Ginette Pitman for anemia. Patient states that she stays fatigued most of the time. She states that she has been taking oral iron and vitamin b12 for at least a couple of years. She denies any blood in her stools, but does have some blood mixed with mucus when she blows her nose.

## 2015-05-28 LAB — PROTEIN ELECTROPHORESIS, SERUM
A/G Ratio: 1.1 (ref 0.7–1.7)
ALPHA-1-GLOBULIN: 0.3 g/dL (ref 0.0–0.4)
Albumin ELP: 3.7 g/dL (ref 2.9–4.4)
Alpha-2-Globulin: 0.8 g/dL (ref 0.4–1.0)
Beta Globulin: 1.5 g/dL — ABNORMAL HIGH (ref 0.7–1.3)
GLOBULIN, TOTAL: 3.4 g/dL (ref 2.2–3.9)
Gamma Globulin: 0.8 g/dL (ref 0.4–1.8)
TOTAL PROTEIN ELP: 7.1 g/dL (ref 6.0–8.5)

## 2015-05-28 LAB — CBC WITH DIFFERENTIAL/PLATELET
BASOS ABS: 0.1 10*3/uL (ref 0–0.1)
EOS ABS: 0.4 10*3/uL (ref 0–0.7)
Eosinophils Relative: 4 %
HEMATOCRIT: 36.9 % (ref 35.0–47.0)
HEMOGLOBIN: 12.1 g/dL (ref 12.0–16.0)
Lymphocytes Relative: 24 %
Lymphs Abs: 2.2 10*3/uL (ref 1.0–3.6)
MCH: 28.7 pg (ref 26.0–34.0)
MCHC: 32.8 g/dL (ref 32.0–36.0)
MCV: 87.4 fL (ref 80.0–100.0)
Monocytes Absolute: 0.5 10*3/uL (ref 0.2–0.9)
NEUTROS ABS: 6.2 10*3/uL (ref 1.4–6.5)
Platelets: 201 10*3/uL (ref 150–440)
RBC: 4.21 MIL/uL (ref 3.80–5.20)
RDW: 14.4 % (ref 11.5–14.5)
WBC: 9.4 10*3/uL (ref 3.6–11.0)

## 2015-05-28 LAB — ERYTHROPOIETIN: ERYTHROPOIETIN: 18.9 m[IU]/mL — AB (ref 2.6–18.5)

## 2015-05-28 LAB — RETICULOCYTES
RBC.: 4.21 MIL/uL (ref 3.80–5.20)
RETIC COUNT ABSOLUTE: 58.9 10*3/uL (ref 19.0–183.0)
RETIC CT PCT: 1.4 % (ref 0.4–3.1)

## 2015-05-28 LAB — KAPPA/LAMBDA LIGHT CHAINS
KAPPA FREE LGHT CHN: 20.16 mg/L — AB (ref 3.30–19.40)
Kappa, lambda light chain ratio: 1.95 — ABNORMAL HIGH (ref 0.26–1.65)
LAMDA FREE LIGHT CHAINS: 10.34 mg/L (ref 5.71–26.30)

## 2015-05-28 LAB — HAPTOGLOBIN: Haptoglobin: 217 mg/dL — ABNORMAL HIGH (ref 34–200)

## 2015-06-03 ENCOUNTER — Inpatient Hospital Stay (HOSPITAL_BASED_OUTPATIENT_CLINIC_OR_DEPARTMENT_OTHER): Payer: Medicare Other | Admitting: Internal Medicine

## 2015-06-03 ENCOUNTER — Encounter: Payer: Self-pay | Admitting: Internal Medicine

## 2015-06-03 VITALS — BP 118/79 | HR 105 | Temp 99.5°F | Resp 18 | Ht 66.25 in | Wt 209.9 lb

## 2015-06-03 DIAGNOSIS — Z79899 Other long term (current) drug therapy: Secondary | ICD-10-CM | POA: Diagnosis not present

## 2015-06-03 DIAGNOSIS — D649 Anemia, unspecified: Secondary | ICD-10-CM

## 2015-06-04 ENCOUNTER — Other Ambulatory Visit: Payer: Self-pay | Admitting: Internal Medicine

## 2015-06-04 DIAGNOSIS — IMO0001 Reserved for inherently not codable concepts without codable children: Secondary | ICD-10-CM

## 2015-06-04 DIAGNOSIS — R109 Unspecified abdominal pain: Secondary | ICD-10-CM

## 2015-06-07 ENCOUNTER — Ambulatory Visit
Admission: RE | Admit: 2015-06-07 | Discharge: 2015-06-07 | Disposition: A | Discharge status: Home / Self Care | Payer: Medicare Other | Source: Ambulatory Visit | Attending: Internal Medicine | Admitting: Internal Medicine

## 2015-06-07 DIAGNOSIS — K760 Fatty (change of) liver, not elsewhere classified: Secondary | ICD-10-CM | POA: Diagnosis not present

## 2015-06-07 DIAGNOSIS — R140 Abdominal distension (gaseous): Secondary | ICD-10-CM | POA: Diagnosis present

## 2015-06-07 DIAGNOSIS — IMO0001 Reserved for inherently not codable concepts without codable children: Secondary | ICD-10-CM

## 2015-06-07 DIAGNOSIS — K7689 Other specified diseases of liver: Secondary | ICD-10-CM | POA: Insufficient documentation

## 2015-06-07 DIAGNOSIS — R109 Unspecified abdominal pain: Secondary | ICD-10-CM | POA: Diagnosis present

## 2015-06-19 NOTE — Progress Notes (Signed)
[facility]  Telephone:([phone]) [phone] Fax:(336) [phone]     ID: Jasmine Robinson OB: 1950-10-24  MR#: [id number]  JYN#:[id number]  Patient Care Team: Tracie Harrier, MD as PCP - General (Internal Medicine)  CHIEF COMPLAINT/DIAGNOSIS:  Normocytic Anemia of unclear etiology, mild -   hemoglobin spontaneously normalized on repeat CBC done 05/27/15.  Anemia workup also unremarkable, only had mildly elevated serum Kappa free light chains at 20.16 (ref range 3.30-19.40) resulting in mildly elevated Kappa/Lambda light chain ratio of 1.95 (ref range 0.26-1.65). Otherwise lambda free light chain level is normal and SPEP is negative for M-spike.  (prior labs done on 05/02/15 showed hemoglobin 11.3, WBC 7.5, unremarkable differential, platelets 208 ,creatinine 0.8, LFTs unremarkable except alkaline phosphatase 110 , calcium 9.6 , TSH normal at 2.953, ferritin normal at 66, folate level normal at 8.8, B12 normal at 1431).   HISTORY OF PRESENT ILLNESS:  Patient returns for continued hematology follow-up. Repeat labs on 05/27/15 showed hemoglobin has normalized. Patient states that she has chronic fatigue on exertion, she does remain physically active. She is retired but goes to [employer] and does treadmill fairly regularly. Denies any new dyspnea, chest pain, palpitation, dizziness, orthopnea or PND. She denies any obvious bleeding symptoms.   REVIEW OF SYSTEMS:   ROS As in HPI above. No fever or sweats. No abdominal pain, nausea or vomiting. No new bone pains. No numbness or tingling of extremities.    PAST MEDICAL HISTORY: Reviewed. Past Medical History  Diagnosis Date  . Diabetes mellitus   . Depression     PAST SURGICAL HISTORY: Reviewed. Past Surgical History  Procedure Laterality Date  . Abdominal hysterectomy    . Knee surgery  2007    torn meniscus    FAMILY HISTORY: Reviewed. Family History  Problem Relation Age of Onset  . Diabetes Father     SOCIAL  HISTORY: Reviewed. Social History  Substance Use Topics  . Smoking status: Never Smoker   . Smokeless tobacco: Never Used  . Alcohol Use: Yes     Comment: occasionally    No Known Allergies  Current Outpatient Prescriptions  Medication Sig Dispense Refill  . aspirin EC 81 MG tablet Take 81 mg by mouth daily.    Marland Kitchen buPROPion (WELLBUTRIN XL) 150 MG 24 hr tablet Take 150 mg by mouth daily.    . Citalopram Hydrobromide (CELEXA PO) Take by mouth.    . Ferrous Sulfate (IRON) 325 (65 FE) MG TABS Take 1 tablet by mouth daily.    Marland Kitchen glipiZIDE (GLIPIZIDE XL) 5 MG 24 hr tablet Take 10 mg by mouth 2 (two) times daily.    . metFORMIN (GLUCOPHAGE) 500 MG tablet Take 500 mg by mouth 2 (two) times daily with a meal.    . ValACYclovir HCl (VALTREX PO) Take by mouth.    . vitamin B-12 (CYANOCOBALAMIN) 1000 MCG tablet Take 1,000 mcg by mouth daily.     No current facility-administered medications for this visit.    PHYSICAL EXAM: Filed Vitals:   06/03/15 1622  BP: 118/79  Pulse: 105  Temp: 99.5 F (37.5 C)  Resp: 18     Body mass index is 33.61 kg/(m^2).      GENERAL: Patient is alert and oriented and in no acute distress. No icterus or pallor. LUNGS: Bilaterally clear to auscultation, no rhonchi. ABDOMEN: Soft, nontender. No hepatosplenomegaly clinically.  EXTREMITIES: No pedal edema.   LAB RESULTS: 05/27/15 -  hemoglobin 12.1, MCV 87.4, WBC 9.4, unremarkable differential, platelets 201.  Serum EPO 18.9.  Otherwise iron study, retic count serum LDH, haptoglobin, DAT test, SPEP are unremarkable. Serum Kappa free light chains at 20.16 (ref range 3.30-19.40) resulting in mildly elevated Kappa/Lambda light chain ratio of 1.95 (ref range 0.26-1.65). Otherwise lambda free light chain level is normal and SPEP is negative for M-spike.  Lab Results  Component Value Date   WBC 9.4 05/27/2015   NEUTROABS 6.2 05/27/2015   HGB 12.1 05/27/2015   HCT 36.9 05/27/2015   MCV 87.4 05/27/2015   PLT 201  05/27/2015     ASSESSMENT / PLAN:   Normocytic Anemia of unclear etiology, mild   -   hemoglobin spontaneously normalized on repeat CBC done 05/27/15.  Anemia workup also unremarkable, only had mildly elevated serum Kappa free light chains at 20.16 (ref range 3.30-19.40) resulting in mildly elevated Kappa/Lambda light chain ratio of 1.95 (ref range 0.26-1.65). Otherwise lambda free light chain level is normal and SPEP is negative for M-spike. This is likely nonspecific finding. Reviewed labs and discussed with patient. Given that hemoglobin has normalized and she is otherwise doing well without anemia symptoms, have explained that fatigue on exertion is likely from other etiology and she will discuss this with her primary physician. Patient otherwise does not need continued hematology follow-up and is being discharged from our clinic. Recommend repeating serum kappa/lambda light chain ratio at about 6 months from now, patient prefers this also to be monitored by Dr.Hande. I would be happy to see her back in the future if any hematology issues should recur. Patient is agreeable to this plan.   Leia Alf, MD   06/19/2015 9:10 AM

## 2015-06-25 ENCOUNTER — Other Ambulatory Visit: Payer: Self-pay | Admitting: Internal Medicine

## 2015-06-25 DIAGNOSIS — R928 Other abnormal and inconclusive findings on diagnostic imaging of breast: Secondary | ICD-10-CM

## 2015-06-25 DIAGNOSIS — N6489 Other specified disorders of breast: Secondary | ICD-10-CM

## 2015-07-03 ENCOUNTER — Ambulatory Visit
Admission: RE | Admit: 2015-07-03 | Discharge: 2015-07-03 | Disposition: A | Discharge status: Home / Self Care | Payer: Medicare Other | Source: Ambulatory Visit | Attending: Internal Medicine | Admitting: Internal Medicine

## 2015-07-03 ENCOUNTER — Other Ambulatory Visit: Payer: Self-pay | Admitting: Internal Medicine

## 2015-07-03 DIAGNOSIS — R928 Other abnormal and inconclusive findings on diagnostic imaging of breast: Secondary | ICD-10-CM

## 2015-07-03 DIAGNOSIS — N6489 Other specified disorders of breast: Secondary | ICD-10-CM

## 2015-10-21 ENCOUNTER — Emergency Department: Payer: Medicare Other

## 2015-10-21 ENCOUNTER — Emergency Department
Admission: EM | Admit: 2015-10-21 | Discharge: 2015-10-21 | Disposition: A | Discharge status: Home / Self Care | Payer: Medicare Other | Attending: Emergency Medicine | Admitting: Emergency Medicine

## 2015-10-21 ENCOUNTER — Encounter: Payer: Self-pay | Admitting: *Deleted

## 2015-10-21 DIAGNOSIS — R112 Nausea with vomiting, unspecified: Secondary | ICD-10-CM

## 2015-10-21 DIAGNOSIS — E119 Type 2 diabetes mellitus without complications: Secondary | ICD-10-CM | POA: Insufficient documentation

## 2015-10-21 DIAGNOSIS — R1084 Generalized abdominal pain: Secondary | ICD-10-CM | POA: Insufficient documentation

## 2015-10-21 HISTORY — DX: Fatty (change of) liver, not elsewhere classified: K76.0

## 2015-10-21 HISTORY — DX: Sleep apnea, unspecified: G47.30

## 2015-10-21 HISTORY — DX: Gastro-esophageal reflux disease without esophagitis: K21.9

## 2015-10-21 LAB — COMPREHENSIVE METABOLIC PANEL
ALBUMIN: 4 g/dL (ref 3.5–5.0)
ALT: 32 U/L (ref 14–54)
AST: 24 U/L (ref 15–41)
Alkaline Phosphatase: 122 U/L (ref 38–126)
Anion gap: 8 (ref 5–15)
BUN: 13 mg/dL (ref 6–20)
CHLORIDE: 103 mmol/L (ref 101–111)
CO2: 27 mmol/L (ref 22–32)
CREATININE: 0.82 mg/dL (ref 0.44–1.00)
Calcium: 9.4 mg/dL (ref 8.9–10.3)
GFR calc Af Amer: >60 mL/min (ref >60)
GFR calc non Af Amer: >60 mL/min (ref >60)
Glucose, Bld: 132 mg/dL — ABNORMAL HIGH (ref 65–99)
Potassium: 4.4 mmol/L (ref 3.5–5.1)
Sodium: 138 mmol/L (ref 135–145)
Total Bilirubin: 0.6 mg/dL (ref 0.3–1.2)
Total Protein: 7.4 g/dL (ref 6.5–8.1)

## 2015-10-21 LAB — CBC
HCT: 38.5 % (ref 35.0–47.0)
Hemoglobin: 12.4 g/dL (ref 12.0–16.0)
MCH: 28 pg (ref 26.0–34.0)
MCHC: 32.3 g/dL (ref 32.0–36.0)
MCV: 86.7 fL (ref 80.0–100.0)
PLATELETS: 207 10*3/uL (ref 150–440)
RBC: 4.45 MIL/uL (ref 3.80–5.20)
RDW: 14.4 % (ref 11.5–14.5)
WBC: 11.9 10*3/uL — ABNORMAL HIGH (ref 3.6–11.0)

## 2015-10-21 LAB — URINALYSIS COMPLETE WITH MICROSCOPIC (ARMC ONLY)
Bilirubin Urine: NEGATIVE
GLUCOSE, UA: NEGATIVE mg/dL
HGB URINE DIPSTICK: NEGATIVE
Ketones, ur: NEGATIVE mg/dL
Nitrite: NEGATIVE
Protein, ur: 30 mg/dL — AB
Specific Gravity, Urine: 1.025 (ref 1.005–1.030)
pH: 5 (ref 5.0–8.0)

## 2015-10-21 LAB — LIPASE, BLOOD: LIPASE: 22 U/L (ref 11–51)

## 2015-10-21 LAB — TROPONIN I: Troponin I: <0.03 ng/mL (ref <0.031)

## 2015-10-21 MED ORDER — SULFAMETHOXAZOLE-TRIMETHOPRIM 800-160 MG PO TABS: 1.0000 | ORAL_TABLET | Freq: Two times a day (BID) | ORAL | Status: DC

## 2015-10-21 MED ORDER — ONDANSETRON HCL 4 MG PO TABS: 4.0000 mg | ORAL_TABLET | Freq: Once | ORAL | Status: DC

## 2015-10-21 MED ORDER — ONDANSETRON 4 MG PO TBDP
ORAL_TABLET | ORAL | Status: AC
  Filled 2015-10-21: qty 1

## 2015-10-21 MED ORDER — DICYCLOMINE HCL 20 MG PO TABS
20.0000 mg | ORAL_TABLET | Freq: Once | ORAL | Status: AC
  Administered 2015-10-21: 20 mg via ORAL

## 2015-10-21 MED ORDER — ONDANSETRON 4 MG PO TBDP
4.0000 mg | ORAL_TABLET | Freq: Once | ORAL | Status: AC
  Administered 2015-10-21: 4 mg via ORAL

## 2015-10-21 MED ORDER — ONDANSETRON HCL 4 MG PO TABS: 4.0000 mg | ORAL_TABLET | Freq: Every day | ORAL | Status: DC | PRN

## 2015-10-21 MED ORDER — DICYCLOMINE HCL 20 MG PO TABS
ORAL_TABLET | ORAL | Status: AC
  Administered 2015-10-21: 20 mg via ORAL
  Filled 2015-10-21: qty 1

## 2015-10-21 MED ORDER — DICYCLOMINE HCL 20 MG PO TABS: 20.0000 mg | ORAL_TABLET | Freq: Three times a day (TID) | ORAL | Status: DC | PRN

## 2015-10-21 NOTE — ED Provider Notes (Signed)
[facility] Emergency Department Provider Note     Time seen: ----------------------------------------- 7:26 PM on 10/21/2015 -----------------------------------------    I have reviewed the triage vital signs and the nursing notes.   HISTORY  Chief Complaint Abdominal Pain; Diarrhea; and Emesis    HPI Jasmine Robinson is a 65 y.o. female who presents ER for abdominal cramping that started in her lower abdomen and radiates up. Patient states she does have some nausea and gas but has not had diarrhea. This differs from the triage note. Symptom onset around noon today. She states nothing makes her symptoms better or worse.   Past Medical History  Diagnosis Date  . Diabetes mellitus   . Depression   . GERD (gastroesophageal reflux disease)   . Fatty liver   . Sleep apnea     Patient Active Problem List   Diagnosis Date Noted  . Diabetes mellitus 11/12/2011  . Obesity 11/12/2011    Past Surgical History  Procedure Laterality Date  . Abdominal hysterectomy    . Knee surgery  2007    torn meniscus  . Breast biopsy Right 07/03/13    stereo biopsy    Allergies Review of patient's allergies indicates no known allergies.  Social History Social History  Substance Use Topics  . Smoking status: Never Smoker   . Smokeless tobacco: Never Used  . Alcohol Use: Yes     Comment: occasionally    Review of Systems Constitutional: Negative for fever. Eyes: Negative for visual changes. ENT: Negative for sore throat. Cardiovascular: Negative for chest pain. Respiratory: Negative for shortness of breath. Gastrointestinal: Positive for abdominal pain and vomiting Genitourinary: Negative for dysuria. Musculoskeletal: Negative for back pain. Skin: Negative for rash. Neurological: Negative for headaches, focal weakness or numbness.  10-point ROS otherwise negative.  ____________________________________________   PHYSICAL EXAM:  VITAL  SIGNS: ED Triage Vitals  Enc Vitals Group     BP 10/21/15 1835 144/99 mmHg     Pulse Rate 10/21/15 1835 110     Resp 10/21/15 1835 18     Temp 10/21/15 1835 99.3 F (37.4 C)     Temp Source 10/21/15 1835 Oral     SpO2 10/21/15 1835 97 %     Weight 10/21/15 1835 210 lb (95.255 kg)     Height 10/21/15 1835 5\' 6"  (1.676 m)     Head Cir --      Peak Flow --      Pain Score 10/21/15 1836 8     Pain Loc --      Pain Edu? --      Excl. in [place]? --     Constitutional: Alert and oriented. Well appearing and in no distress. Eyes: Conjunctivae are normal. PERRL. Normal extraocular movements. ENT   Head: Normocephalic and atraumatic.   Nose: No congestion/rhinnorhea.   Mouth/Throat: Mucous membranes are moist.   Neck: No stridor. Cardiovascular: Normal rate, regular rhythm. Normal and symmetric distal pulses are present in all extremities. No murmurs, rubs, or gallops. Respiratory: Normal respiratory effort without tachypnea nor retractions. Breath sounds are clear and equal bilaterally. No wheezes/rales/rhonchi. Gastrointestinal: Mild epigastric tenderness, no rebound or guarding. Normal bowel sounds. Musculoskeletal: Nontender with normal range of motion in all extremities. No joint effusions.  No lower extremity tenderness nor edema. Neurologic:  Normal speech and language. No gross focal neurologic deficits are appreciated. Speech is normal.  Skin:  Skin is warm, dry and intact. No rash noted. Psychiatric: Mood and affect are normal. Speech  and behavior are normal. Patient exhibits appropriate insight and judgment. ____________________________________________  ED COURSE:  Pertinent labs & imaging results that were available during my care of the patient were reviewed by me and considered in my medical decision making (see chart for details). Patient is in no acute distress, likely gastroenteritis or GERD. ____________________________________________    LABS (pertinent  positives/negatives)  Labs Reviewed  COMPREHENSIVE METABOLIC PANEL - Abnormal; Notable for the following:    Glucose, Bld 132 (*)    All other components within normal limits  CBC - Abnormal; Notable for the following:    WBC 11.9 (*)    All other components within normal limits  URINALYSIS COMPLETEWITH MICROSCOPIC ([facility] ONLY) - Abnormal; Notable for the following:    Bacteria, UA RARE (*)    Squamous Epithelial / LPF 0-5 (*)    All other components within normal limits  LIPASE, BLOOD  TROPONIN I    RADIOLOGY Images were viewed by me  Abdomen 2 view IMPRESSION: Unremarkable bowel gas pattern; no free intra-abdominal air seen. Small amount of stool noted in the colon. ____________________________________________  FINAL ASSESSMENT AND PLAN  Vomiting  Plan: Patient with labs and imaging as dictated above. Patient with vomiting and some stomach upset. Patient has a benign reassuring examination, I will discharge her with antiemetics and antacids. She is stable for outpatient follow-up.   Earleen Newport, MD   Earleen Newport, MD 10/21/15 [phone]

## 2015-10-21 NOTE — Discharge Instructions (Signed)
Abdominal Pain, Adult °Many things can cause abdominal pain. Usually, abdominal pain is not caused by a disease and will improve without treatment. It can often be observed and treated at home. Your health care provider will do a physical exam and possibly order blood tests and X-rays to help determine the seriousness of your pain. However, in many cases, more time must pass before a clear cause of the pain can be found. Before that point, your health care provider may not know if you need more testing or further treatment. °HOME CARE INSTRUCTIONS °Monitor your abdominal pain for any changes. The following actions may help to alleviate any discomfort you are experiencing: °· Only take over-the-counter or prescription medicines as directed by your health care provider. °· Do not take laxatives unless directed to do so by your health care provider. °· Try a clear liquid diet (broth, tea, or water) as directed by your health care provider. Slowly move to a bland diet as tolerated. °SEEK MEDICAL CARE IF: °· You have unexplained abdominal pain. °· You have abdominal pain associated with nausea or diarrhea. °· You have pain when you urinate or have a bowel movement. °· You experience abdominal pain that wakes you in the night. °· You have abdominal pain that is worsened or improved by eating food. °· You have abdominal pain that is worsened with eating fatty foods. °· You have a fever. °SEEK IMMEDIATE MEDICAL CARE IF: °· Your pain does not go away within 2 hours. °· You keep throwing up (vomiting). °· Your pain is felt only in portions of the abdomen, such as the right side or the left lower portion of the abdomen. °· You pass bloody or black tarry stools. °MAKE SURE YOU: °· Understand these instructions. °· Will watch your condition. °· Will get help right away if you are not doing well or get worse. °  °This information is not intended to replace advice given to you by your health care provider. Make sure you discuss  any questions you have with your health care provider. °  °Document Released: 07/01/2005 Document Revised: 06/12/2015 Document Reviewed: 05/31/2013 °Elsevier Interactive Patient Education ©2016 [employer] ° °Nausea and Vomiting °Nausea is a sick feeling that often comes before throwing up (vomiting). Vomiting is a reflex where stomach contents come out of your mouth. Vomiting can cause severe loss of body fluids (dehydration). Children and elderly adults can become dehydrated quickly, especially if they also have diarrhea. Nausea and vomiting are symptoms of a condition or disease. It is important to find the cause of your symptoms. °CAUSES  °· Direct irritation of the stomach lining. This irritation can result from increased acid production (gastroesophageal reflux disease), infection, food poisoning, taking certain medicines (such as nonsteroidal anti-inflammatory drugs), alcohol use, or tobacco use. °· Signals from the brain. These signals could be caused by a headache, heat exposure, an inner ear disturbance, increased pressure in the brain from injury, infection, a tumor, or a concussion, pain, emotional stimulus, or metabolic problems. °· An obstruction in the gastrointestinal tract (bowel obstruction). °· Illnesses such as diabetes, hepatitis, gallbladder problems, appendicitis, kidney problems, cancer, sepsis, atypical symptoms of a heart attack, or eating disorders. °· Medical treatments such as chemotherapy and radiation. °· Receiving medicine that makes you sleep (general anesthetic) during surgery. °DIAGNOSIS °Your caregiver may ask for tests to be done if the problems do not improve after a few days. Tests may also be done if symptoms are severe or if the reason for the   nausea and vomiting is not clear. Tests may include: °· Urine tests. °· Blood tests. °· Stool tests. °· Cultures (to look for evidence of infection). °· X-rays or other imaging studies. °Test results can help your caregiver make  decisions about treatment or the need for additional tests. °TREATMENT °You need to stay well hydrated. Drink frequently but in small amounts. You may wish to drink water, sports drinks, clear broth, or eat frozen ice pops or gelatin dessert to help stay hydrated. When you eat, eating slowly may help prevent nausea. There are also some antinausea medicines that may help prevent nausea. °HOME CARE INSTRUCTIONS  °· Take all medicine as directed by your caregiver. °· If you do not have an appetite, do not force yourself to eat. However, you must continue to drink fluids. °· If you have an appetite, eat a normal diet unless your caregiver tells you differently. °¨ Eat a variety of complex carbohydrates (rice, wheat, potatoes, bread), lean meats, yogurt, fruits, and vegetables. °¨ Avoid high-fat foods because they are more difficult to digest. °· Drink enough water and fluids to keep your urine clear or pale yellow. °· If you are dehydrated, ask your caregiver for specific rehydration instructions. Signs of dehydration may include: °¨ Severe thirst. °¨ Dry lips and mouth. °¨ Dizziness. °¨ Dark urine. °¨ Decreasing urine frequency and amount. °¨ Confusion. °¨ Rapid breathing or pulse. °SEEK IMMEDIATE MEDICAL CARE IF:  °· You have blood or brown flecks (like coffee grounds) in your vomit. °· You have black or bloody stools. °· You have a severe headache or stiff neck. °· You are confused. °· You have severe abdominal pain. °· You have chest pain or trouble breathing. °· You do not urinate at least once every 8 hours. °· You develop cold or clammy skin. °· You continue to vomit for longer than 24 to 48 hours. °· You have a fever. °MAKE SURE YOU:  °· Understand these instructions. °· Will watch your condition. °· Will get help right away if you are not doing well or get worse. °  °This information is not intended to replace advice given to you by your health care provider. Make sure you discuss any questions you have with  your health care provider. °  °Document Released: 09/21/2005 Document Revised: 12/14/2011 Document Reviewed: 02/18/2011 °Elsevier Interactive Patient Education ©2016 Elsevier Inc. ° °

## 2015-10-21 NOTE — ED Notes (Signed)
pt states abd cramping that starts in her lower abd and radiates up, states nausea, gas, and diaherra since about 1200 today

## 2016-01-08 ENCOUNTER — Other Ambulatory Visit: Payer: Self-pay | Admitting: Physician Assistant

## 2016-01-08 DIAGNOSIS — R519 Headache, unspecified: Secondary | ICD-10-CM

## 2016-01-08 DIAGNOSIS — R42 Dizziness and giddiness: Secondary | ICD-10-CM

## 2016-01-08 DIAGNOSIS — R51 Headache: Secondary | ICD-10-CM

## 2016-01-08 DIAGNOSIS — H5010 Unspecified exotropia: Secondary | ICD-10-CM

## 2016-01-09 ENCOUNTER — Ambulatory Visit
Admission: RE | Admit: 2016-01-09 | Discharge: 2016-01-09 | Disposition: A | Discharge status: Home / Self Care | Payer: Medicare Other | Source: Ambulatory Visit | Attending: Physician Assistant | Admitting: Physician Assistant

## 2016-01-09 DIAGNOSIS — H5010 Unspecified exotropia: Secondary | ICD-10-CM

## 2016-01-09 DIAGNOSIS — R51 Headache: Secondary | ICD-10-CM | POA: Insufficient documentation

## 2016-01-09 DIAGNOSIS — R519 Headache, unspecified: Secondary | ICD-10-CM

## 2016-01-09 DIAGNOSIS — R42 Dizziness and giddiness: Secondary | ICD-10-CM | POA: Diagnosis present

## 2016-01-09 DIAGNOSIS — R9082 White matter disease, unspecified: Secondary | ICD-10-CM | POA: Insufficient documentation

## 2016-01-09 MED ORDER — GADOBENATE DIMEGLUMINE 529 MG/ML IV SOLN
20.0000 mL | Freq: Once | INTRAVENOUS | Status: AC | PRN
  Administered 2016-01-09: 20 mL via INTRAVENOUS

## 2016-05-05 ENCOUNTER — Other Ambulatory Visit: Payer: Self-pay

## 2016-05-05 DIAGNOSIS — F32A Depression, unspecified: Secondary | ICD-10-CM | POA: Insufficient documentation

## 2016-05-05 DIAGNOSIS — G4733 Obstructive sleep apnea (adult) (pediatric): Secondary | ICD-10-CM | POA: Insufficient documentation

## 2016-05-05 DIAGNOSIS — F329 Major depressive disorder, single episode, unspecified: Secondary | ICD-10-CM | POA: Insufficient documentation

## 2016-05-07 ENCOUNTER — Ambulatory Visit (INDEPENDENT_AMBULATORY_CARE_PROVIDER_SITE_OTHER): Payer: Medicare Other | Admitting: Gastroenterology

## 2016-05-07 ENCOUNTER — Encounter: Payer: Self-pay | Admitting: Gastroenterology

## 2016-05-07 VITALS — BP 145/70 | HR 101 | Temp 98.2°F | Ht 67.0 in | Wt 209.0 lb

## 2016-05-07 DIAGNOSIS — R1013 Epigastric pain: Secondary | ICD-10-CM | POA: Diagnosis not present

## 2016-05-07 DIAGNOSIS — R140 Abdominal distension (gaseous): Secondary | ICD-10-CM | POA: Diagnosis not present

## 2016-05-07 DIAGNOSIS — K5900 Constipation, unspecified: Secondary | ICD-10-CM | POA: Diagnosis not present

## 2016-05-07 NOTE — Progress Notes (Signed)
Gastroenterology Consultation  Referring Provider:     Tracie Harrier, MD Primary Care Physician:  Tracie Harrier, MD Primary Gastroenterologist:  Dr. Allen Norris     Reason for Consultation:     Gas and bloating        HPI:   Jasmine Robinson is a 65 y.o. y/o female referred for consultation & management of Gas and bloating by Dr. Tracie Harrier, MD.  This patient comes today with a report of having excessive gas with bloating and  Foul smelling flatulence.  The patient also reports that she continuously burps and that is also foul smelling.  The patient was on Nexium for heartburn at she states it does not change her symptoms.  The patient also has sleep apnea and is supposed to be on CPAP but reports she has not been using it.  There is no report of any unexplained weight loss.  She also denies any fevers or chills.  The patient was followed by Dr. Candace Cruise in the past and is now here to see me because he has left town.  The patient reports that she's also had an EGD and colonoscopy in the past.  The patient denies any family history of colon cancer or colon polyps but she does report stomach cancer in some second-degree relatives. The patient reports that she does not move her bowels regularly.  Past Medical History:  Diagnosis Date  . Depression   . Diabetes mellitus   . Fatty liver   . GERD (gastroesophageal reflux disease)   . Sleep apnea     Past Surgical History:  Procedure Laterality Date  . ABDOMINAL HYSTERECTOMY    . BREAST BIOPSY Right 07/03/13   stereo biopsy  . KNEE SURGERY  2007   torn meniscus    Prior to Admission medications   Medication Sig Start Date End Date Taking? Authorizing Provider  aspirin EC 81 MG tablet Take 81 mg by mouth daily.   Yes Historical Provider, MD  esomeprazole (NEXIUM) 40 MG capsule Take by mouth. 04/04/16  Yes Historical Provider, MD  Ferrous Sulfate (IRON) 325 (65 FE) MG TABS Take 1 tablet by mouth daily.   Yes Historical Provider, MD    glipiZIDE (GLIPIZIDE XL) 5 MG 24 hr tablet Take 10 mg by mouth 2 (two) times daily.   Yes Historical Provider, MD  glucose blood (ONE TOUCH ULTRA TEST) test strip USE ONE STRIP TO CHECK BLOOD GLUCOSE LEVELS TWICE DAILY AS DIRECTED 01/09/15  Yes Historical Provider, MD  Lancet Devices ([employer] LANCING DEVICE) MISC Use 1 each as directed. Use as instructed.   Yes Historical Provider, MD  Liraglutide (VICTOZA) 18 MG/3ML SOPN Inject into the skin. 02/06/16  Yes Historical Provider, MD  metFORMIN (GLUCOPHAGE) 500 MG tablet Take 500 mg by mouth 2 (two) times daily with a meal.   Yes Historical Provider, MD  RABEprazole (ACIPHEX) 20 MG tablet  03/25/16  Yes Historical Provider, MD  ranitidine (ZANTAC) 150 MG tablet Take 150 mg by mouth 2 (two) times daily.   Yes Historical Provider, MD  ValACYclovir HCl (VALTREX PO) Take by mouth.   Yes Historical Provider, MD  vitamin B-12 (CYANOCOBALAMIN) 1000 MCG tablet Take 1,000 mcg by mouth daily.   Yes Historical Provider, MD  buPROPion (WELLBUTRIN XL) 150 MG 24 hr tablet Take 150 mg by mouth daily.    Historical Provider, MD  Citalopram Hydrobromide (CELEXA PO) Take by mouth.    Historical Provider, MD  dicyclomine (BENTYL) 20 MG tablet Take 1 tablet (  20 mg total) by mouth 3 (three) times daily as needed for spasms. Patient not taking: Reported on 05/07/2016 10/21/15 10/20/16  Earleen Newport, MD  ondansetron (ZOFRAN) 4 MG tablet Take 1 tablet (4 mg total) by mouth daily as needed for nausea or vomiting. Patient not taking: Reported on 05/07/2016 10/21/15   Earleen Newport, MD  sucralfate (CARAFATE) 1 g tablet Take by mouth. 04/04/16   Historical Provider, MD  sulfamethoxazole-trimethoprim (BACTRIM DS) 800-160 MG tablet Take 1 tablet by mouth 2 (two) times daily. Patient not taking: Reported on 05/07/2016 10/21/15   Earleen Newport, MD    Family History  Problem Relation Age of Onset  . Diabetes Father   . Breast cancer Cousin      Social History  Substance Use  Topics  . Smoking status: Never Smoker  . Smokeless tobacco: Never Used  . Alcohol use Yes     Comment: occasionally    Allergies as of 05/07/2016  . (No Known Allergies)    Review of Systems:    All systems reviewed and negative except where noted in HPI.   Physical Exam:  BP (!) 145/70   Pulse (!) 101   Temp 98.2 F (36.8 C) (Oral)   Ht 5\' 7"  (1.702 m)   Wt 209 lb (94.8 kg)   BMI 32.73 kg/m  No LMP recorded. Patient has had a hysterectomy. Psych:  Alert and cooperative. Normal mood and affect. General:   Alert,  Well-developed, well-nourished, pleasant and cooperative in NAD Head:  Normocephalic and atraumatic. Eyes:  Sclera clear, no icterus.   Conjunctiva pink. Ears:  Normal auditory acuity. Nose:  No deformity, discharge, or lesions. Mouth:  No deformity or lesions,oropharynx pink & moist. Neck:  Supple; no masses or thyromegaly. Lungs:  Respirations even and unlabored.  Clear throughout to auscultation.   No wheezes, crackles, or rhonchi. No acute distress. Heart:  Regular rate and rhythm; no murmurs, clicks, rubs, or gallops. Abdomen:  Normal bowel sounds.  No bruits.  Soft, non-tender and non-distended without masses, hepatosplenomegaly or hernias noted.  No guarding or rebound tenderness.  Negative Carnett sign.   Rectal:  Deferred.  Msk:  Symmetrical without gross deformities.  Good, equal movement & strength bilaterally. Pulses:  Normal pulses noted. Extremities:  No clubbing or edema.  No cyanosis. Neurologic:  Alert and oriented x3;  grossly normal neurologically. Skin:  Intact without significant lesions or rashes.  No jaundice. Lymph Nodes:  No significant cervical adenopathy. Psych:  Alert and cooperative. Normal mood and affect.  Imaging Studies: No results found.  Assessment and Plan:   Jasmine Robinson is a 65 y.o. y/o female who comes today with a report of Excessive flatulence and burping without any weight loss.  The patient has been  explained that the flatulence is likely related to have frequent she is moving her bowels and what she is eating.  The patient has also been told that what she eats determines the smell of her flatulence and belching.  She states that she thinks it may be related to her sleep apnea since she is no longer using her CPAP.  Has been told to go back on her CPAP nightly and she has been given samples of Dexilant.  She has also been told to try to move her bowels more frequently to eliminate the whey so the smell of her flatulence is not so bothersome. The patient has also been told to take MiraLAX to help with her bowel  movements. The patient has been explained the plan and agrees with it.   Note: This dictation was prepared with Dragon dictation along with smaller phrase technology. Any transcriptional errors that result from this process are unintentional.

## 2016-05-07 NOTE — Patient Instructions (Signed)
You have been given samples of Dexilant 60mg  today. Please take 1 capsule daily. If medication works well, please call Jasmine Robinson and request a prescription at [phone].  You will need to start using your CPAP nightly.  Take 1 cap full of Miralax daily. As directed on the bottle.

## 2016-06-01 ENCOUNTER — Other Ambulatory Visit: Payer: Self-pay | Admitting: Internal Medicine

## 2016-06-01 DIAGNOSIS — Z1231 Encounter for screening mammogram for malignant neoplasm of breast: Secondary | ICD-10-CM

## 2016-06-05 ENCOUNTER — Other Ambulatory Visit: Payer: Self-pay

## 2016-06-05 ENCOUNTER — Telehealth: Payer: Self-pay | Admitting: Gastroenterology

## 2016-06-05 MED ORDER — DEXLANSOPRAZOLE 60 MG PO CPDR: 60.0000 mg | DELAYED_RELEASE_CAPSULE | Freq: Every day | ORAL | 0 refills | Status: DC

## 2016-06-05 NOTE — Telephone Encounter (Signed)
Patient would like a refill on Dexilant and also she has been reading and found a home  remedy to help her stomach that contains sulfur and would like to know if Dr. Allen Norris will call her in some.

## 2016-06-10 ENCOUNTER — Other Ambulatory Visit: Payer: Self-pay

## 2016-06-10 DIAGNOSIS — K2100 Gastro-esophageal reflux disease with esophagitis, without bleeding: Secondary | ICD-10-CM

## 2016-06-10 MED ORDER — DEXLANSOPRAZOLE 60 MG PO CPDR: 60.0000 mg | DELAYED_RELEASE_CAPSULE | Freq: Every day | ORAL | 11 refills | Status: DC

## 2016-06-10 NOTE — Telephone Encounter (Signed)
Rx for Dexilant 60 mg has been sent to pt's pharmacy with refills.

## 2016-07-03 ENCOUNTER — Ambulatory Visit: Payer: Medicare Other

## 2016-07-09 ENCOUNTER — Other Ambulatory Visit: Payer: Self-pay | Admitting: Internal Medicine

## 2016-07-09 ENCOUNTER — Ambulatory Visit
Admission: RE | Admit: 2016-07-09 | Discharge: 2016-07-09 | Disposition: A | Discharge status: Home / Self Care | Payer: Medicare Other | Source: Ambulatory Visit | Attending: Internal Medicine | Admitting: Internal Medicine

## 2016-07-09 DIAGNOSIS — Z1231 Encounter for screening mammogram for malignant neoplasm of breast: Secondary | ICD-10-CM

## 2016-08-23 ENCOUNTER — Ambulatory Visit: Payer: Medicare Other | Attending: Internal Medicine

## 2016-08-23 DIAGNOSIS — G4733 Obstructive sleep apnea (adult) (pediatric): Secondary | ICD-10-CM | POA: Diagnosis not present

## 2016-08-23 DIAGNOSIS — R0683 Snoring: Secondary | ICD-10-CM | POA: Diagnosis present

## 2016-08-23 DIAGNOSIS — G4710 Hypersomnia, unspecified: Secondary | ICD-10-CM | POA: Diagnosis not present

## 2017-01-10 DIAGNOSIS — E1165 Type 2 diabetes mellitus with hyperglycemia: Secondary | ICD-10-CM | POA: Insufficient documentation

## 2017-01-21 ENCOUNTER — Encounter: Payer: Medicare Other | Attending: Internal Medicine | Admitting: *Deleted

## 2017-01-21 ENCOUNTER — Encounter: Payer: Self-pay | Admitting: *Deleted

## 2017-01-21 VITALS — BP 120/70 | Ht 67.0 in | Wt 199.6 lb

## 2017-01-21 DIAGNOSIS — E119 Type 2 diabetes mellitus without complications: Secondary | ICD-10-CM | POA: Diagnosis not present

## 2017-01-21 DIAGNOSIS — Z713 Dietary counseling and surveillance: Secondary | ICD-10-CM | POA: Diagnosis present

## 2017-01-21 NOTE — Progress Notes (Signed)
Diabetes Self-Management Education  Visit Type: First/Initial  Appt. Start Time: 1100 Appt. End Time: 1215  01/21/2017  [name], identified by name and date of birth, is a 66 y.o. female with a diagnosis of Diabetes: Type 2.   ASSESSMENT  Blood pressure 120/70, height 5\' 7"  (1.702 m), weight 199 lb 9.6 oz (90.5 kg). Body mass index is 31.26 kg/m.      Diabetes Self-Management Education - 01/21/17 1240      Visit Information   Visit Type First/Initial     Initial Visit   Diabetes Type Type 2   Are you currently following a meal plan? No   Are you taking your medications as prescribed? Yes   Date Diagnosed 10+ years     Health Coping   How would you rate your overall health? Good     Psychosocial Assessment   Patient Belief/Attitude about Diabetes Motivated to manage diabetes   Self-care barriers None   Self-management support Doctor's office   Patient Concerns Nutrition/Meal planning;Medication;Monitoring;Glycemic Control;Healthy Lifestyle;Weight Control   Special Needs None   Preferred Learning Style Auditory;Visual;Hands on   Learning Readiness Ready   How often do you need to have someone help you when you read instructions, pamphlets, or other written materials from your doctor or pharmacy? 1 - Never   What is the last grade level you completed in school? [employer]     Pre-Education Assessment   Patient understands the diabetes disease and treatment process. Needs Review   Patient understands incorporating nutritional management into lifestyle. Needs Review   Patient undertands incorporating physical activity into lifestyle. Needs Review   Patient understands using medications safely. Needs Instruction   Patient understands monitoring blood glucose, interpreting and using results Needs Review   Patient understands prevention, detection, and treatment of acute complications. Needs Instruction   Patient understands prevention, detection, and treatment of  chronic complications. Needs Review   Patient understands how to develop strategies to address psychosocial issues. Needs Review   Patient understands how to develop strategies to promote health/change behavior. Needs Review     Complications   Last HgB A1C per patient/outside source 7.9 %  11/18/16   How often do you check your blood sugar? 1-2 times/day   Fasting Blood glucose range (mg/dL) 70-129;130-179  Pt reports FBG's 106-136 mg/dL since starting Actos.   Have you had a dilated eye exam in the past 12 months? Yes   Have you had a dental exam in the past 12 months? Yes   Are you checking your feet? No     Dietary Intake   Breakfast fruit with every breakfast (oatmeal with banana, grilled pork chop with grits and fruit) or bacon, egg biscuit   Snack (morning) fruit, sesame stick   Lunch chicken, brussel sprouts and fruit; tuna or chicken salad sandwich; soup; BBQ sandwich   Snack (afternoon) fruit, peanut butter   Dinner grilled pork chop, baked chicken, grilled steak or shrimp with rice, salad, white or sweet potato, limas and corn   Snack (evening) jello with fruit   Beverage(s) water, unsweetened coffee     Exercise   Exercise Type Light (walking / raking leaves)   How many days per week to you exercise? 4   How many minutes per day do you exercise? 45   Total minutes per week of exercise 180     Patient Education   Previous Diabetes Education Yes (please comment)  here at [facility]   Disease state  Explored patient's  options for treatment of their diabetes   Nutrition management  Role of diet in the treatment of diabetes and the relationship between the three main macronutrients and blood glucose level;Food label reading, portion sizes and measuring food.;Reviewed blood glucose goals for pre and post meals and how to evaluate the patients' food intake on their blood glucose level.;Information on hints to eating out and maintain blood glucose control.   Physical activity and  exercise  Role of exercise on diabetes management, blood pressure control and cardiac health.   Medications Reviewed patients medication for diabetes, action, purpose, timing of dose and side effects.   Monitoring Purpose and frequency of SMBG.;Taught/discussed recording of test results and interpretation of SMBG.;Identified appropriate SMBG and/or A1C goals.   Chronic complications Relationship between chronic complications and blood glucose control   Psychosocial adjustment Identified and addressed patients feelings and concerns about diabetes     Individualized Goals (developed by patient)   Reducing Risk Improve blood sugars Decrease medications Prevent diabetes complications Lose weight Lead a healthier lifestyle Become more fit     Outcomes   Expected Outcomes Demonstrated interest in learning. Expect positive outcomes      Individualized Plan for Diabetes Self-Management Training:   Learning Objective:  Patient will have a greater understanding of diabetes self-management. Patient education plan is to attend individual and/or group sessions per assessed needs and concerns.   Plan:   Patient Instructions  Check blood sugars 2 x day before breakfast and 2 hrs after one meal every day Exercise: Continue walking  for  30-60  minutes  3-5 days a week Eat 3 meals day,   2-3 snacks a day Space meals 4-6 hours apart Decrease portion sizes Limit fruits and include small protein serving Complete 3 Day Food Record and bring to next appt Bring blood sugar records to the next appointment Return for appointment on: Thursday Feb 11, 2017 at 1:15 pm with Pam (dietitian)  Expected Outcomes:  Demonstrated interest in learning. Expect positive outcomes  Education material provided:  General Meal Planning Guidelines Simple Meal Plan 3 Day Food Record  If problems or questions, patient to contact team via:  Johny Drilling, RN, [place], CDE [phone]  Future DSME appointment:   Thursday Feb 11, 2017 with dietitian

## 2017-01-21 NOTE — Patient Instructions (Addendum)
Check blood sugars 2 x day before breakfast and 2 hrs after one meal every day  Exercise: Continue walking  for  30-60  minutes  3-5 days a week  Eat 3 meals day,   2-3 snacks a day Space meals 4-6 hours apart Decrease portion sizes Limit fruits and include small protein serving Complete 3 Day Food Record and bring to next appt  Bring blood sugar records to the next appointment  Return for appointment on: Thursday Feb 11, 2017 at 1:15 pm with [facility] (dietitian)

## 2017-02-11 ENCOUNTER — Encounter: Payer: Self-pay | Admitting: Dietician

## 2017-02-11 ENCOUNTER — Encounter: Payer: Medicare Other | Attending: Internal Medicine | Admitting: Dietician

## 2017-02-11 VITALS — BP 118/68 | Ht 67.0 in | Wt 195.4 lb

## 2017-02-11 DIAGNOSIS — E119 Type 2 diabetes mellitus without complications: Secondary | ICD-10-CM | POA: Diagnosis not present

## 2017-02-11 DIAGNOSIS — Z713 Dietary counseling and surveillance: Secondary | ICD-10-CM | POA: Diagnosis present

## 2017-02-11 NOTE — Progress Notes (Signed)
Diabetes Self-Management Education  Visit Type:  Follow-up  Appt. Start Time: 1320 Appt. End Time: 4081  02/11/2017  Ms. Drema Halon, identified by name and date of birth, is a 66 y.o. female with a diagnosis of Diabetes:  .   ASSESSMENT  Blood pressure 118/68, height 5\' 7"  (1.702 m), weight 195 lb 6.4 oz (88.6 kg). Body mass index is 30.6 kg/m.       Diabetes Self-Management Education - 44/81/85 6314      Complications   How often do you check your blood sugar? 1-2 times/day   Fasting Blood glucose range (mg/dL) 70-129;130-179   Postprandial Blood glucose range (mg/dL) 70-129;>200;130-179  3 readings: 113, 137, 315 (after blooming onion, potato, and sweet drink at [facility]   Have you had a dilated eye exam in the past 12 months? Yes   Have you had a dental exam in the past 12 months? Yes   Are you checking your feet? Yes  brief look   How many days per week are you checking your feet? 7     Dietary Intake   Breakfast 3 meals and 3 snacks daily   Beverage(s) mostly water (doesn't enjoy it), loves sweet tea, has kept to 2 glasses in past 2 months     Exercise   Exercise Type Light (walking / raking leaves)   How many days per week to you exercise? 4   How many minutes per day do you exercise? 60   Total minutes per week of exercise 240     Patient Education   Nutrition management  Role of diet in the treatment of diabetes and the relationship between the three main macronutrients and blood glucose level;Food label reading, portion sizes and measuring food.;Information on hints to eating out and maintain blood glucose control.;Meal options for control of blood glucose level and chronic complications.;Other (comment)  basic meal planning for 1400kcal, portion control, beverages   Physical activity and exercise  Role of exercise on diabetes management, blood pressure control and cardiac health.   Monitoring Taught/discussed recording of test results and interpretation of SMBG.    Chronic complications Assessed and discussed foot care and prevention of foot problems     Post-Education Assessment   Patient understands the diabetes disease and treatment process. Demonstrates understanding / competency   Patient understands incorporating nutritional management into lifestyle. Demonstrates understanding / competency   Patient undertands incorporating physical activity into lifestyle. Demonstrates understanding / competency   Patient understands using medications safely. Demonstrates understanding / competency   Patient understands monitoring blood glucose, interpreting and using results Demonstrates understanding / competency   Patient understands prevention, detection, and treatment of acute complications. Needs Review   Patient understands prevention, detection, and treatment of chronic complications. Demonstrates understanding / competency   Patient understands how to develop strategies to address psychosocial issues. Demonstrates understanding / competency   Patient understands how to develop strategies to promote health/change behavior. Demonstrates understanding / competency     Outcomes   Program Status Completed      Learning Objective:  Patient will have a greater understanding of diabetes self-management. Patient education plan is to attend individual and/or group sessions per assessed needs and concerns.  Ms. Osmer is generally making healthy food choices, eating low-carb vegetables often, including healthy protein choices. She feels her starch portions are too large, especially when dining out. She sometimes drinks sugar-sweetened beverages such as sweet tea, Sprite, or Ginger ale. We addressed these habits during our discussion today.  Plan:   Patient Instructions   Keep up your healthy food choices and regular exercise, great job!  Control portions of starches to 1 cup (fist-size) or less at a meal. If you have a whole cup, then avoid other sources  of carbohydrate.  Choose sugar-free beverages as much as possible. You can gradually decrease the amount of sugar in some drinks like tea.     Expected Outcomes:  Demonstrated interest in learning. Expect positive outcomes  Education material provided: Snack sheet, Dining Healthy when Dining out; Planning A Balanced Meal  If problems or questions, patient to contact team via:  Phone

## 2017-02-11 NOTE — Patient Instructions (Signed)
   Keep up your healthy food choices and regular exercise, great job!  Control portions of starches to 1 cup (fist-size) or less at a meal. If you have a whole cup, then avoid other sources of carbohydrate.  Choose sugar-free beverages as much as possible. You can gradually decrease the amount of sugar in some drinks like tea.

## 2017-04-02 IMAGING — MG MM DIGITAL SCREENING BILAT W/ TOMO W/ CAD
8 of 12 series · 8 of 28 positions shown · non-contrast
Comparison: Previous exam(s).

CLINICAL DATA: Screening.

EXAM:
2D DIGITAL SCREENING BILATERAL MAMMOGRAM WITH CAD AND ADJUNCT TOMO

[L MLO synth-2D]
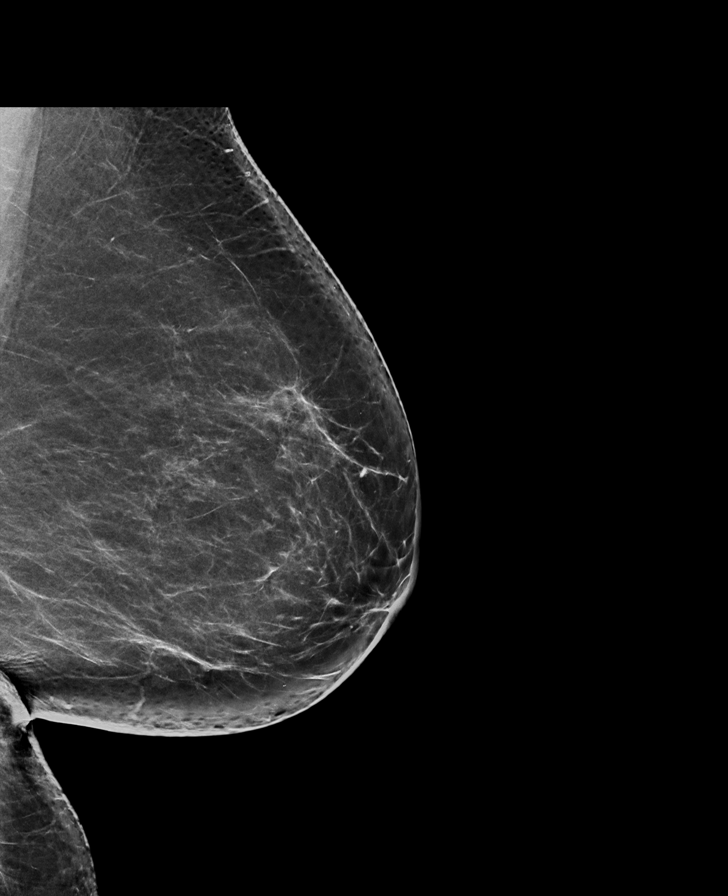

[R CC synth-2D]
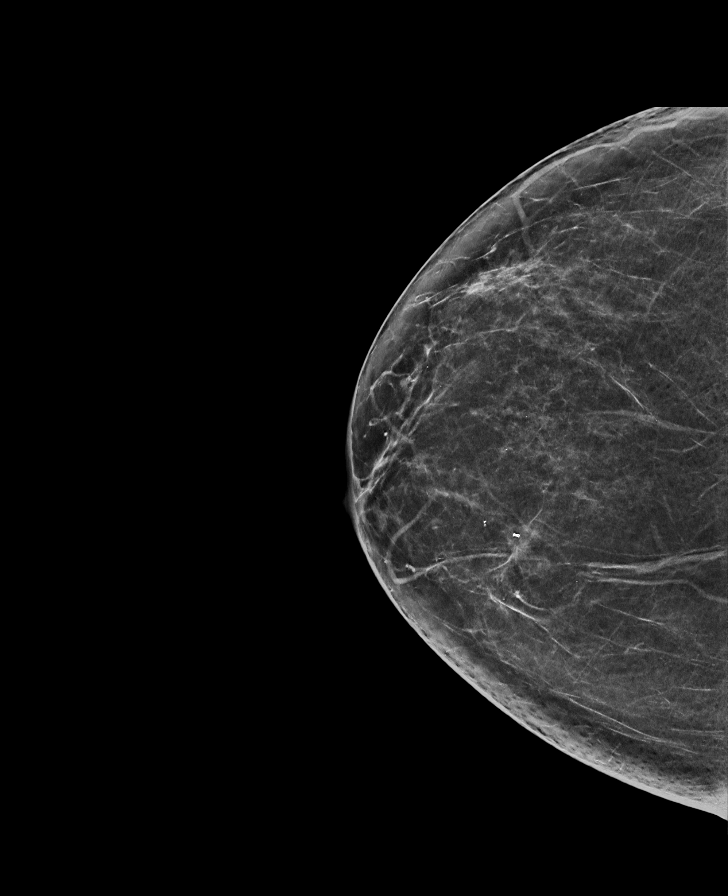

[R MLO]
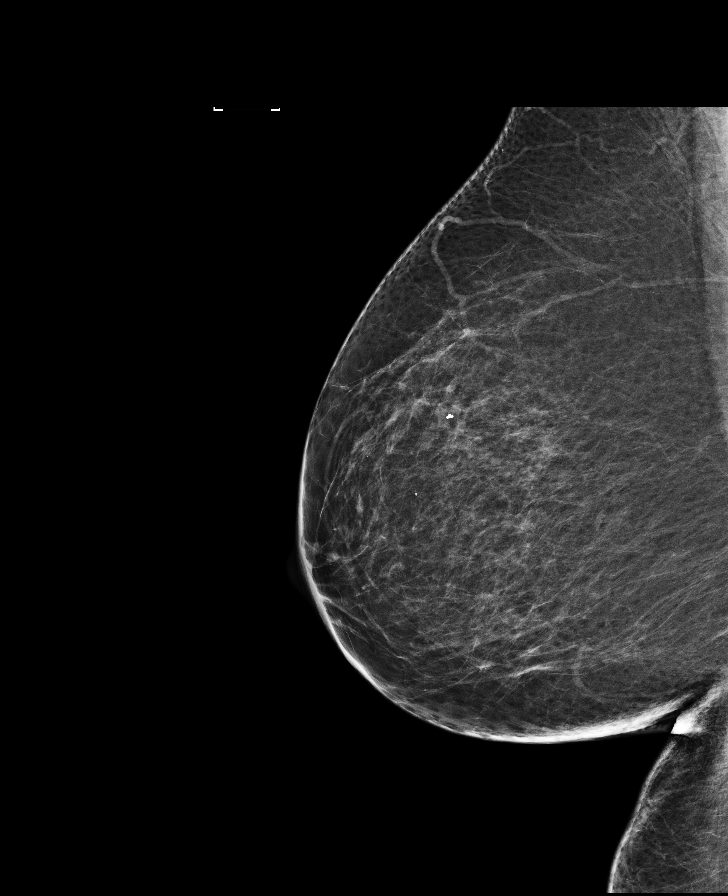

[L CC]
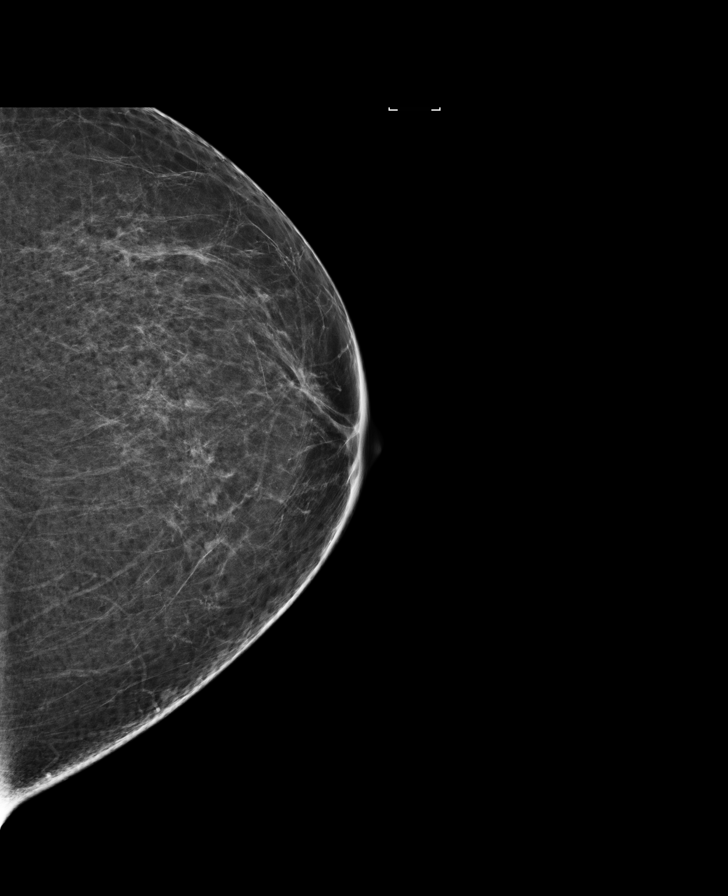

[L CC synth-2D]
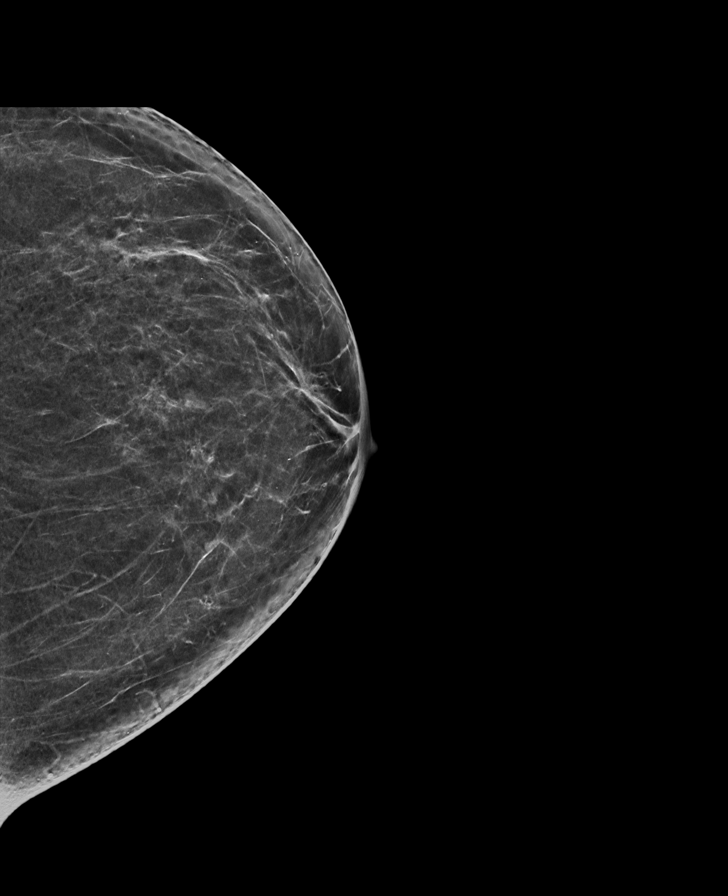

[R MLO synth-2D]
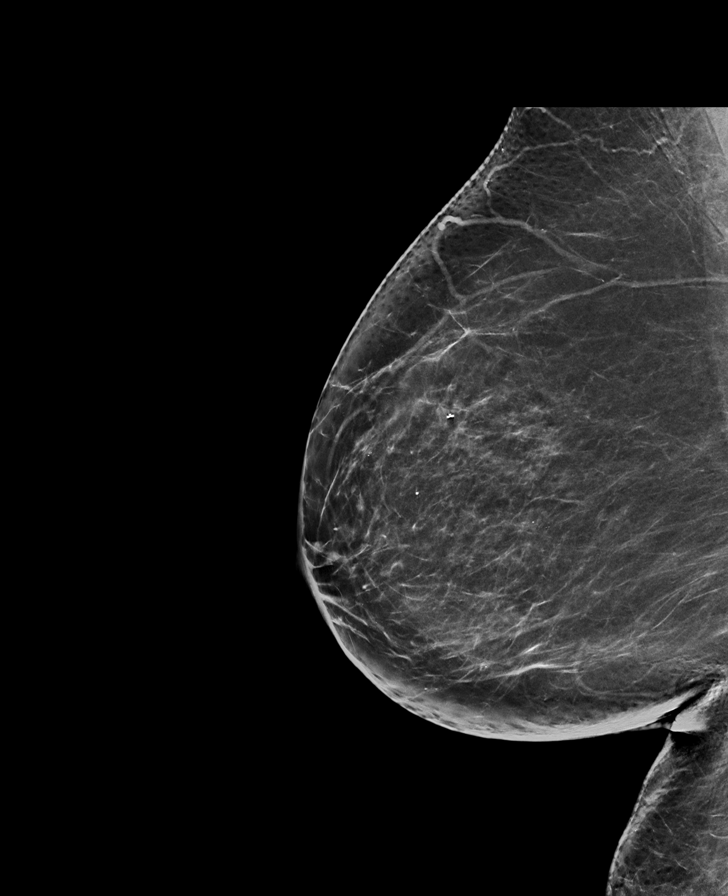

[L MLO]
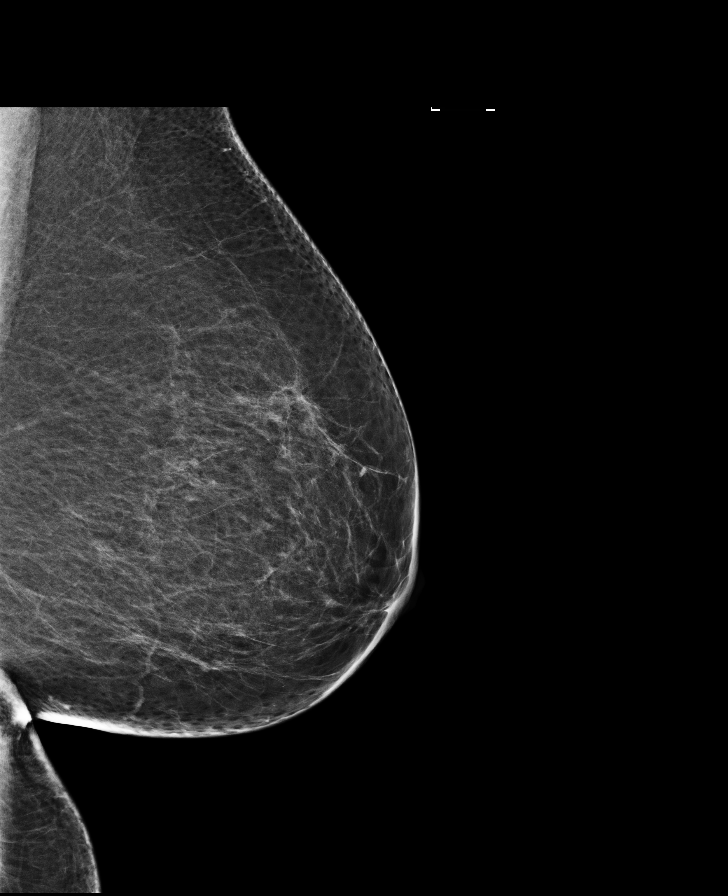

[R CC]
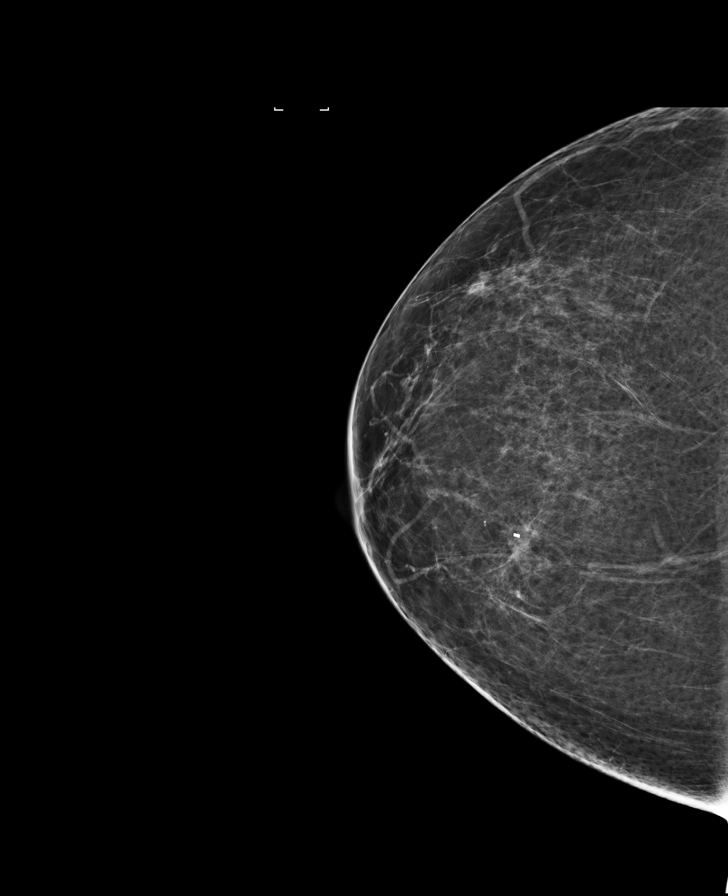

[8 of 28 positions shown; findings below may reference images not displayed]

ACR Breast Density Category b: There are scattered areas of
fibroglandular density.
FINDINGS: There are no findings suspicious for malignancy. Images were
processed with CAD.
IMPRESSION: No mammographic evidence of malignancy. A result letter of this
screening mammogram will be mailed directly to the patient.

RECOMMENDATION:
Screening mammogram in one year. (Code:97-6-RS4)

BI-RADS CATEGORY  1: Negative.

## 2017-06-11 ENCOUNTER — Other Ambulatory Visit: Payer: Self-pay | Admitting: Internal Medicine

## 2017-06-11 DIAGNOSIS — Z1231 Encounter for screening mammogram for malignant neoplasm of breast: Secondary | ICD-10-CM

## 2017-07-12 ENCOUNTER — Ambulatory Visit
Admission: RE | Admit: 2017-07-12 | Discharge: 2017-07-12 | Disposition: A | Discharge status: Home / Self Care | Payer: Medicare Other | Source: Ambulatory Visit | Attending: Internal Medicine | Admitting: Internal Medicine

## 2017-07-12 DIAGNOSIS — Z1231 Encounter for screening mammogram for malignant neoplasm of breast: Secondary | ICD-10-CM | POA: Diagnosis not present

## 2018-02-09 ENCOUNTER — Other Ambulatory Visit: Payer: Self-pay

## 2018-02-09 ENCOUNTER — Ambulatory Visit (INDEPENDENT_AMBULATORY_CARE_PROVIDER_SITE_OTHER): Payer: Medicare Other | Admitting: Podiatry

## 2018-02-09 ENCOUNTER — Ambulatory Visit (INDEPENDENT_AMBULATORY_CARE_PROVIDER_SITE_OTHER): Payer: Medicare Other

## 2018-02-09 ENCOUNTER — Encounter: Payer: Self-pay | Admitting: Podiatry

## 2018-02-09 DIAGNOSIS — M779 Enthesopathy, unspecified: Secondary | ICD-10-CM | POA: Diagnosis not present

## 2018-02-09 DIAGNOSIS — M7750 Other enthesopathy of unspecified foot: Secondary | ICD-10-CM

## 2018-02-09 DIAGNOSIS — R600 Localized edema: Secondary | ICD-10-CM

## 2018-02-09 MED ORDER — TRIAMCINOLONE ACETONIDE 10 MG/ML IJ SUSP
10.0000 mg | Freq: Once | INTRAMUSCULAR | Status: AC
  Administered 2018-02-09: 10 mg

## 2018-02-09 NOTE — Progress Notes (Signed)
D

## 2018-02-09 NOTE — Progress Notes (Signed)
   Subjective:    Patient ID: Jasmine Robinson, female    DOB: 06/27/1951, 67 y.o.   MRN: [id number]  HPI    Review of Systems  All other systems reviewed and are negative.      Objective:   Physical Exam        Assessment & Plan:

## 2018-02-09 NOTE — Progress Notes (Signed)
Subjective:   Patient ID: Jasmine Robinson, female   DOB: 67 y.o.   MRN: [id number]   HPI Patient presents stating she is having a lot of pain in the side of the right ankle and she has had some swelling which does not bother her but the pain is bothering her.  Patient does not smoke likes to be active   Review of Systems  All other systems reviewed and are negative.       Objective:  Physical Exam  Constitutional: She appears well-developed and well-nourished.  Cardiovascular: Intact distal pulses.  Pulmonary/Chest: Effort normal.  Musculoskeletal: Normal range of motion.  Neurological: She is alert.  Skin: Skin is warm.  Nursing note and vitals reviewed.   Neurovascular status was found to be intact with muscle strength adequate range of motion within normal limits with exquisite discomfort in the sinus tarsi right with inflammation of the sinus tarsi.  Mild edema is noted of the lateral ankle with lipoma possible formation no indication of tendon injury or other peritoneal pathology.  Patient has good digital perfusion well oriented x3     Assessment:  Inflammatory capsulitis sinus tarsi right with mild swelling noted     Plan:  H&P condition reviewed and injected the sinus tarsi today 3 mg Kenalog 5 mg Xylocaine applied compression stocking to help milk out the swelling and if symptoms were to persist will be seen back to reevaluate  X-ray indicates no indications of arthritis or bone pathology

## 2018-04-13 DIAGNOSIS — E782 Mixed hyperlipidemia: Secondary | ICD-10-CM | POA: Insufficient documentation

## 2018-04-13 DIAGNOSIS — E1169 Type 2 diabetes mellitus with other specified complication: Secondary | ICD-10-CM | POA: Insufficient documentation

## 2018-06-07 ENCOUNTER — Other Ambulatory Visit: Payer: Self-pay | Admitting: Internal Medicine

## 2018-06-07 DIAGNOSIS — Z1231 Encounter for screening mammogram for malignant neoplasm of breast: Secondary | ICD-10-CM

## 2018-06-13 ENCOUNTER — Emergency Department: Payer: Medicare Other

## 2018-06-13 ENCOUNTER — Encounter: Payer: Self-pay | Admitting: Emergency Medicine

## 2018-06-13 ENCOUNTER — Emergency Department
Admission: EM | Admit: 2018-06-13 | Discharge: 2018-06-13 | Disposition: A | Discharge status: Home / Self Care | Payer: Medicare Other | Attending: Emergency Medicine | Admitting: Emergency Medicine

## 2018-06-13 ENCOUNTER — Other Ambulatory Visit: Payer: Self-pay

## 2018-06-13 DIAGNOSIS — S0083XA Contusion of other part of head, initial encounter: Secondary | ICD-10-CM | POA: Insufficient documentation

## 2018-06-13 DIAGNOSIS — Z7984 Long term (current) use of oral hypoglycemic drugs: Secondary | ICD-10-CM | POA: Diagnosis not present

## 2018-06-13 DIAGNOSIS — S0990XA Unspecified injury of head, initial encounter: Secondary | ICD-10-CM | POA: Diagnosis present

## 2018-06-13 DIAGNOSIS — Y929 Unspecified place or not applicable: Secondary | ICD-10-CM | POA: Diagnosis not present

## 2018-06-13 DIAGNOSIS — Z79899 Other long term (current) drug therapy: Secondary | ICD-10-CM | POA: Insufficient documentation

## 2018-06-13 DIAGNOSIS — Y9389 Activity, other specified: Secondary | ICD-10-CM | POA: Diagnosis not present

## 2018-06-13 DIAGNOSIS — Z7982 Long term (current) use of aspirin: Secondary | ICD-10-CM | POA: Insufficient documentation

## 2018-06-13 DIAGNOSIS — Y990 Civilian activity done for income or pay: Secondary | ICD-10-CM | POA: Diagnosis not present

## 2018-06-13 DIAGNOSIS — E119 Type 2 diabetes mellitus without complications: Secondary | ICD-10-CM | POA: Diagnosis not present

## 2018-06-13 NOTE — ED Provider Notes (Signed)
[facility] Emergency Department Provider Note ____________________________________________  Time seen: 1935  I have reviewed the triage vital signs and the nursing notes.  HISTORY  Chief Complaint  Head Injury  HPI Jasmine Robinson is a 67 y.o. female with a history of DM, OSA, and depression, presents to the ED accompanied by family for a evaluation of a head injury.  Patient reports from work, describing an injury to the top of her head.  She apparently got hit on her forehead by the trunk of her car.  She was putting items in the trunk, when the door come down on her head, unexpectedly. She notes the injury occurred at about 4 pm. She works as a Development worker, community for a Neurosurgeon. She allowed another worker to drive, after the incident. She prevents now for further evaluation. She denies any loss of consciousness, nausea, vomiting, dizziness, laceration, abrasion, or bleeding from the forehead.  The patient takes no blood thinners and denies any abnormal bruising or bleeding.  She denies any other injury at this time.  Past Medical History:  Diagnosis Date  . Depression   . Diabetes mellitus   . Fatty liver   . GERD (gastroesophageal reflux disease)   . Sleep apnea     Patient Active Problem List   Diagnosis Date Noted  . Uncontrolled type 2 diabetes mellitus with hyperglycemia, without long-term current use of insulin ([place]) 01/10/2017  . Depression 05/05/2016  . Obstructive sleep apnea 05/05/2016  . Anemia 05/08/2015  . Diabetes mellitus 11/12/2011  . Obesity 11/12/2011    Past Surgical History:  Procedure Laterality Date  . ABDOMINAL HYSTERECTOMY    . BREAST BIOPSY Right 07/03/13   stereo biopsy/ clip-neg  . KNEE SURGERY  2007   torn meniscus    Prior to Admission medications   Medication Sig Start Date End Date Taking? Authorizing Provider  ALPRAZolam (XANAX) 0.25 MG tablet Take 0.25 mg by mouth 2 (two) times daily as needed.     [provider]  aspirin EC 81 MG tablet Take 81 mg by mouth daily.    [provider]  baclofen (LIORESAL) 10 MG tablet Take 10 mg by mouth 2 (two) times daily as needed. 06/03/16   [provider]  Blood Glucose Monitoring Suppl (FIFTY50 GLUCOSE METER 2.0) w/Device KIT Use as directed. ONE TOUCH Dx E11.9 04/06/17   [provider]  buPROPion (WELLBUTRIN XL) 150 MG 24 hr tablet Take 150 mg by mouth 2 (two) times daily.     [provider]  citalopram (CELEXA) 20 MG tablet Take 20 mg by mouth daily. 11/27/16 11/27/17  [provider]  cyanocobalamin (V-R VITAMIN B-12) 500 MCG tablet Take 500 mcg by mouth daily.    [provider]  Ferrous Sulfate (IRON) 325 (65 FE) MG TABS Take 1 tablet by mouth daily.    [provider]  glipiZIDE (GLUCOTROL XL) 10 MG 24 hr tablet Take 10 mg by mouth 2 (two) times daily. 01/08/17   [provider]  glucose blood (ONE TOUCH ULTRA TEST) test strip USE ONE STRIP TO CHECK BLOOD GLUCOSE LEVELS TWICE DAILY AS DIRECTED 01/09/15   [provider]  JARDIANCE 25 MG TABS tablet  01/13/18   [provider]  Lancet Devices ([employer] LANCING DEVICE) MISC Use 1 each as directed. Use as instructed.    [provider]  metFORMIN (GLUCOPHAGE) 1000 MG tablet Take 1,000 mg by mouth 2 (two) times daily. 12/11/16  [provider]  Jonetta Speak LANCETS 09X [place]  12/28/17   [provider]  pioglitazone (ACTOS) 15 MG tablet Take 15 mg by mouth daily. 01/05/17 01/05/18  [provider]  valACYclovir (VALTREX) 1000 MG tablet Take 500 mg by mouth daily. 01/08/17   [provider]    Allergies Patient has no known allergies.  Family History  Problem Relation Age of Onset  . Diabetes Father   . Breast cancer Cousin     Social History Social History   Tobacco Use  . Smoking status: Never Smoker  . Smokeless tobacco: Never Used  Substance Use Topics  .  Alcohol use: Yes    Comment: occasional   . Drug use: No    Review of Systems  Constitutional: Negative for fever. Eyes: Negative for visual changes. ENT: Negative for sore throat. Cardiovascular: Negative for chest pain. Respiratory: Negative for shortness of breath. Gastrointestinal: Negative for abdominal pain, vomiting and diarrhea. Genitourinary: Negative for dysuria. Musculoskeletal: Negative for back pain. Skin: Negative for rash.  Soft tissue swelling to the right forehead over the right brow Neurological: Negative for headaches, focal weakness or numbness. ____________________________________________  PHYSICAL EXAM:  VITAL SIGNS: ED Triage Vitals  Enc Vitals Group     BP 06/13/18 1914 134/77     Pulse Rate 06/13/18 1914 99     Resp 06/13/18 1914 18     Temp 06/13/18 1914 98.2 F (36.8 C)     Temp Source 06/13/18 1914 Oral     SpO2 06/13/18 1914 98 %     Weight 06/13/18 1915 200 lb (90.7 kg)     Height 06/13/18 1915 '5\' 7"'$  (1.702 m)     Head Circumference --      Peak Flow --      Pain Score 06/13/18 1914 8     Pain Loc --      Pain Edu? --      Excl. in [place]? --     Constitutional: Alert and oriented. Well appearing and in no distress. Head: Normocephalic and atraumatic. Eyes: Conjunctivae are normal. PERRL. Normal extraocular movements fundi bilaterally. Ears: Canals clear. TMs intact bilaterally. Nose: No congestion/rhinorrhea/epistaxis. Mouth/Throat: Mucous membranes are moist. Neck: Supple.  Range of motion without crepitus.  No distracting midline tenderness is appreciated. Cardiovascular: Normal rate, regular rhythm. Normal distal pulses. Respiratory: Normal respiratory effort. No wheezes/rales/rhonchi. Musculoskeletal: Nontender with normal range of motion in all extremities.  Neurologic: Cranial nerves II through XII grossly intact.  Normal intrinsic and opposition testing.  Normal UE/LE DTRs bilaterally.  Normal finger-to-nose exam.  Stable tandem  walk on exam.  No pronator drift.  Normal gait without ataxia. Normal speech and language. No gross focal neurologic deficits are appreciated. Skin:  Skin is warm, dry and intact. No rash noted. Psychiatric: Mood and affect are normal. Patient exhibits appropriate insight and judgment. ____________________________________________  PROCEDURES  Procedures CT Maxillofacial w/o CM  IMPRESSION: No evidence of facial or orbital fracture. Right forehead soft tissue swelling. ____________________________________________  INITIAL IMPRESSION / ASSESSMENT AND PLAN / ED COURSE  ED evaluation of injury sustained this afternoon while at work.  Patient sustained a contusion to the forehead on the trunk of her limousine came down on her.  She sustained an hematoma without laceration or abrasion.  There was no loss of consciousness.  Patient's exam is overall benign without any acute neuromuscular deficit.  In the 8 hours since the accident she is had no decline of her cognition activity.  She presents for evaluation and is reassured by her negative facial CT.  She will discharge with instructions to dose Tylenol as needed.  She will follow-up with her provider for ongoing symptoms, or return to the ED as needed. ____________________________________________  FINAL CLINICAL IMPRESSION(S) / ED DIAGNOSES  Final diagnoses:  Minor head injury without loss of consciousness, initial encounter  Contusion of other part of head, initial encounter      Melvenia Needles, PA-C 06/13/18 2034    Eula Listen, MD 06/14/18 [phone]

## 2018-06-13 NOTE — ED Triage Notes (Signed)
Pt in via POV from work, reports hitting head on trunk door of vehicle, pt denies LOC, denies blood thinners.  Pt A/Ox4, NAD noted at this time.

## 2018-06-13 NOTE — Discharge Instructions (Signed)
Your exam and facial CT scan are reassuring following your forehead contusion. You can expect to have swelling, and potentially bruising, over the next few days. Apply cool compresses to promote healing. Take OTC Tylenol for pain relief. Follow-up with your provider for ongoing symptoms. Return as discussed.

## 2018-07-13 ENCOUNTER — Ambulatory Visit
Admission: RE | Admit: 2018-07-13 | Discharge: 2018-07-13 | Disposition: A | Discharge status: Home / Self Care | Payer: Medicare Other | Source: Ambulatory Visit | Attending: Internal Medicine | Admitting: Internal Medicine

## 2018-07-13 DIAGNOSIS — Z1231 Encounter for screening mammogram for malignant neoplasm of breast: Secondary | ICD-10-CM | POA: Insufficient documentation

## 2018-08-11 DIAGNOSIS — E1165 Type 2 diabetes mellitus with hyperglycemia: Secondary | ICD-10-CM | POA: Insufficient documentation

## 2019-06-08 ENCOUNTER — Other Ambulatory Visit: Payer: Self-pay | Admitting: Internal Medicine

## 2019-06-08 DIAGNOSIS — Z1231 Encounter for screening mammogram for malignant neoplasm of breast: Secondary | ICD-10-CM

## 2019-07-19 ENCOUNTER — Ambulatory Visit
Admission: RE | Admit: 2019-07-19 | Discharge: 2019-07-19 | Disposition: A | Discharge status: Home / Self Care | Payer: Medicare Other | Source: Ambulatory Visit | Attending: Internal Medicine | Admitting: Internal Medicine

## 2019-07-19 DIAGNOSIS — Z1231 Encounter for screening mammogram for malignant neoplasm of breast: Secondary | ICD-10-CM

## 2019-10-09 ENCOUNTER — Ambulatory Visit: Payer: Medicare Other | Attending: Internal Medicine

## 2019-10-09 DIAGNOSIS — Z20822 Contact with and (suspected) exposure to covid-19: Secondary | ICD-10-CM

## 2019-10-10 LAB — NOVEL CORONAVIRUS, NAA: SARS-CoV-2, NAA: NOT DETECTED

## 2020-06-24 ENCOUNTER — Other Ambulatory Visit: Payer: Self-pay | Admitting: Internal Medicine

## 2020-06-24 DIAGNOSIS — Z1231 Encounter for screening mammogram for malignant neoplasm of breast: Secondary | ICD-10-CM

## 2020-07-19 ENCOUNTER — Other Ambulatory Visit: Payer: Self-pay

## 2020-07-19 ENCOUNTER — Ambulatory Visit
Admission: RE | Admit: 2020-07-19 | Discharge: 2020-07-19 | Disposition: A | Discharge status: Home / Self Care | Payer: Medicare Other | Source: Ambulatory Visit | Attending: Internal Medicine | Admitting: Internal Medicine

## 2020-07-19 DIAGNOSIS — Z1231 Encounter for screening mammogram for malignant neoplasm of breast: Secondary | ICD-10-CM | POA: Diagnosis present

## 2021-03-02 ENCOUNTER — Emergency Department: Payer: Medicare Other

## 2021-03-02 ENCOUNTER — Emergency Department
Admission: EM | Admit: 2021-03-02 | Discharge: 2021-03-02 | Disposition: A | Discharge status: Home / Self Care | Payer: Medicare Other | Attending: Emergency Medicine | Admitting: Emergency Medicine

## 2021-03-02 ENCOUNTER — Encounter: Payer: Self-pay | Admitting: Emergency Medicine

## 2021-03-02 ENCOUNTER — Other Ambulatory Visit: Payer: Self-pay

## 2021-03-02 DIAGNOSIS — Z7984 Long term (current) use of oral hypoglycemic drugs: Secondary | ICD-10-CM | POA: Insufficient documentation

## 2021-03-02 DIAGNOSIS — Z7982 Long term (current) use of aspirin: Secondary | ICD-10-CM | POA: Insufficient documentation

## 2021-03-02 DIAGNOSIS — Z794 Long term (current) use of insulin: Secondary | ICD-10-CM | POA: Diagnosis not present

## 2021-03-02 DIAGNOSIS — Z20822 Contact with and (suspected) exposure to covid-19: Secondary | ICD-10-CM | POA: Insufficient documentation

## 2021-03-02 DIAGNOSIS — R079 Chest pain, unspecified: Secondary | ICD-10-CM | POA: Insufficient documentation

## 2021-03-02 DIAGNOSIS — R110 Nausea: Secondary | ICD-10-CM | POA: Diagnosis not present

## 2021-03-02 DIAGNOSIS — K209 Esophagitis, unspecified without bleeding: Secondary | ICD-10-CM

## 2021-03-02 DIAGNOSIS — E119 Type 2 diabetes mellitus without complications: Secondary | ICD-10-CM | POA: Diagnosis not present

## 2021-03-02 LAB — D-DIMER, QUANTITATIVE: D-Dimer, Quant: 3.05 ug/mL-FEU — ABNORMAL HIGH (ref 0.00–0.50)

## 2021-03-02 LAB — COMPREHENSIVE METABOLIC PANEL
ALT: 13 U/L (ref 0–44)
AST: 20 U/L (ref 15–41)
Albumin: 4.1 g/dL (ref 3.5–5.0)
Alkaline Phosphatase: 109 U/L (ref 38–126)
Anion gap: 10 (ref 5–15)
BUN: 22 mg/dL (ref 8–23)
CO2: 24 mmol/L (ref 22–32)
Calcium: 9.4 mg/dL (ref 8.9–10.3)
Chloride: 102 mmol/L (ref 98–111)
Creatinine, Ser: 0.94 mg/dL (ref 0.44–1.00)
GFR, Estimated: >60 mL/min (ref >60)
Glucose, Bld: 106 mg/dL — ABNORMAL HIGH (ref 70–99)
Potassium: 5.1 mmol/L (ref 3.5–5.1)
Sodium: 136 mmol/L (ref 135–145)
Total Bilirubin: 0.8 mg/dL (ref 0.3–1.2)
Total Protein: 7.6 g/dL (ref 6.5–8.1)

## 2021-03-02 LAB — CBC
HCT: 39.1 % (ref 36.0–46.0)
Hemoglobin: 12.5 g/dL (ref 12.0–15.0)
MCH: 30 pg (ref 26.0–34.0)
MCHC: 32 g/dL (ref 30.0–36.0)
MCV: 93.8 fL (ref 80.0–100.0)
Platelets: 226 10*3/uL (ref 150–400)
RBC: 4.17 MIL/uL (ref 3.87–5.11)
RDW: 14.2 % (ref 11.5–15.5)
WBC: 12.3 10*3/uL — ABNORMAL HIGH (ref 4.0–10.5)
nRBC: 0 % (ref 0.0–0.2)

## 2021-03-02 LAB — RESP PANEL BY RT-PCR (FLU A&B, COVID) ARPGX2
Influenza A by PCR: NEGATIVE
Influenza B by PCR: NEGATIVE
SARS Coronavirus 2 by RT PCR: NEGATIVE

## 2021-03-02 LAB — TROPONIN I (HIGH SENSITIVITY)
Troponin I (High Sensitivity): 9 ng/L (ref <18)
Troponin I (High Sensitivity): 6 ng/L (ref <18)

## 2021-03-02 LAB — BRAIN NATRIURETIC PEPTIDE: B Natriuretic Peptide: 24 pg/mL (ref 0.0–100.0)

## 2021-03-02 MED ORDER — PANTOPRAZOLE SODIUM 40 MG PO TBEC: 40.0000 mg | DELAYED_RELEASE_TABLET | Freq: Every day | ORAL | 1 refills | Status: DC

## 2021-03-02 MED ORDER — SODIUM CHLORIDE 0.9 % IV BOLUS
1000.0000 mL | Freq: Once | INTRAVENOUS | Status: AC
  Administered 2021-03-02: 1000 mL via INTRAVENOUS

## 2021-03-02 MED ORDER — IOHEXOL 350 MG/ML SOLN
75.0000 mL | Freq: Once | INTRAVENOUS | Status: AC | PRN
  Administered 2021-03-02: 75 mL via INTRAVENOUS

## 2021-03-02 NOTE — ED Triage Notes (Signed)
Pt reports intermittent cp that started at 10am this am. Pt reports pain is sometimes sharp and sometimes feels like something is stuck in her chest. Pt also reports some nausea but denies pain that radiates or SOB

## 2021-03-02 NOTE — ED Provider Notes (Signed)
[facility] Emergency Department Provider Note  Time seen: 5:04 PM  I have reviewed the triage vital signs and the nursing notes.   HISTORY  Chief Complaint Chest Pain and Nausea   HPI Jasmine Robinson is a 70 y.o. female with a past medical history of depression, diabetes, gastric reflux, presents to the emergency department for  mild chest pain.  According to the patient since this morning she has been experiencing mild pain in her chest.  States some nausea but denies any vomiting.  No shortness of breath.  Patient has a largely negative review of systems, no fever cough or congestion.  No abdominal pain.  No leg pain or swelling.  Past Medical History:  Diagnosis Date  . Depression   . Diabetes mellitus   . Fatty liver   . GERD (gastroesophageal reflux disease)   . Sleep apnea     Patient Active Problem List   Diagnosis Date Noted  . Uncontrolled type 2 diabetes mellitus with hyperglycemia, without long-term current use of insulin ([place]) 01/10/2017  . Depression 05/05/2016  . Obstructive sleep apnea 05/05/2016  . Anemia 05/08/2015  . Diabetes mellitus 11/12/2011  . Obesity 11/12/2011    Past Surgical History:  Procedure Laterality Date  . ABDOMINAL HYSTERECTOMY    . BREAST BIOPSY Right 07/03/13   stereo biopsy/ clip- tophat-neg  . KNEE SURGERY  2007   torn meniscus    Prior to Admission medications   Medication Sig Start Date End Date Taking? Authorizing Provider  ALPRAZolam (XANAX) 0.25 MG tablet Take 0.25 mg by mouth 2 (two) times daily as needed.    [provider]  aspirin EC 81 MG tablet Take 81 mg by mouth daily.    [provider]  baclofen (LIORESAL) 10 MG tablet Take 10 mg by mouth 2 (two) times daily as needed. 06/03/16   [provider]  Blood Glucose Monitoring Suppl (FIFTY50 GLUCOSE METER 2.0) w/Device KIT Use as directed. ONE TOUCH Dx E11.9 04/06/17   [provider]  buPROPion (WELLBUTRIN  XL) 150 MG 24 hr tablet Take 150 mg by mouth 2 (two) times daily.     [provider]  citalopram (CELEXA) 20 MG tablet Take 20 mg by mouth daily. 11/27/16 11/27/17  [provider]  cyanocobalamin (V-R VITAMIN B-12) 500 MCG tablet Take 500 mcg by mouth daily.    [provider]  Ferrous Sulfate (IRON) 325 (65 FE) MG TABS Take 1 tablet by mouth daily.    [provider]  glipiZIDE (GLUCOTROL XL) 10 MG 24 hr tablet Take 10 mg by mouth 2 (two) times daily. 01/08/17   [provider]  glucose blood (ONE TOUCH ULTRA TEST) test strip USE ONE STRIP TO CHECK BLOOD GLUCOSE LEVELS TWICE DAILY AS DIRECTED 01/09/15   [provider]  JARDIANCE 25 MG TABS tablet  01/13/18   [provider]  Lancet Devices ([employer] LANCING DEVICE) MISC Use 1 each as directed. Use as instructed.    [provider]  metFORMIN (GLUCOPHAGE) 1000 MG tablet Take 1,000 mg by mouth 2 (two) times daily. 12/11/16   [provider]  ONETOUCH DELICA LANCETS 46E MISC  12/28/17   [provider]  pioglitazone (ACTOS) 15 MG tablet Take 15 mg by mouth daily. 01/05/17 01/05/18  [provider]  valACYclovir (VALTREX) 1000 MG tablet Take 500 mg by mouth daily. 01/08/17   [provider]    No Known Allergies  Family History  Problem  Relation Age of Onset  . Diabetes Father   . Breast cancer Cousin     Social History Social History   Tobacco Use  . Smoking status: Never Smoker  . Smokeless tobacco: Never Used  Vaping Use  . Vaping Use: Never used  Substance Use Topics  . Alcohol use: Yes    Comment: occasional   . Drug use: No    Review of Systems Constitutional: Negative for fever. Cardiovascular: Mild central chest discomfort. Respiratory: Negative for shortness of breath. Gastrointestinal: Negative for abdominal pain.  Mild nausea, none currently Genitourinary: Negative for urinary compaints Musculoskeletal: Negative for  musculoskeletal complaints Neurological: Negative for headache All other ROS negative  ____________________________________________   PHYSICAL EXAM:  VITAL SIGNS: ED Triage Vitals  Enc Vitals Group     BP 03/02/21 1622 126/65     Pulse Rate 03/02/21 1622 (!) 119     Resp 03/02/21 1622 16     Temp 03/02/21 1622 98.5 F (36.9 C)     Temp Source 03/02/21 1622 Oral     SpO2 03/02/21 1622 98 %     Weight 03/02/21 1614 182 lb (82.6 kg)     Height 03/02/21 1614 '5\' 7"'  (1.702 m)     Head Circumference --      Peak Flow --      Pain Score 03/02/21 1614 8     Pain Loc --      Pain Edu? --      Excl. in [place]? --    Constitutional: Alert and oriented. Well appearing and in no distress. Eyes: Normal exam ENT      Head: Normocephalic and atraumatic.      Mouth/Throat: Mucous membranes are moist. Cardiovascular: Normal rate, regular rhythm.  Respiratory: Normal respiratory effort without tachypnea nor retractions. Breath sounds are clear  Gastrointestinal: Soft and nontender. No distention. Musculoskeletal: Nontender with normal range of motion in all extremities.  Neurologic:  Normal speech and language. No gross focal neurologic deficits Skin:  Skin is warm, dry and intact.  Psychiatric: Mood and affect are normal.   ____________________________________________    EKG  EKG viewed and interpreted by myself shows sinus tachycardia 125 bpm with a narrow QRS, normal axis, normal intervals, no concerning ST changes.  ____________________________________________    RADIOLOGY  Chest x-ray is negative. CT scan of the chest is negative for PE but does show possible esophagitis, splenic aneurysm small adrenal nodule.  ____________________________________________   INITIAL IMPRESSION / ASSESSMENT AND PLAN / ED COURSE  Pertinent labs & imaging results that were available during my care of the patient were reviewed by me and considered in my medical decision making (see chart for  details).   Patient presents to the emergency department for mild central chest pain since 10:00 this morning.  Patient found to be mildly tachycardic around 120 bpm.  We will check labs including cardiac enzymes and a D-dimer.  Patient satting 97 to 98% on room air.  Denies any history of tachycardia previously.  Denies any dyspnea or diaphoresis.  Differential is quite broad at this time would include ACS, PE, infectious etiology such as COVID or pneumonia.  Patient's lab work shows an elevated D-dimer otherwise largely nonrevealing with negative troponin.  Given the elevated D-dimer we have obtained a CT scan of the chest.  Negative for PE but does show signs of esophagitis I discussed with the patient given the wall thickening the esophagus is important that she follows up with a GI doctor  for an endoscopy.  Patient agreeable to plan of care.  Also discussed the incidental findings of a splenic artery aneurysm and an adrenal nodule requiring PCP follow-up.  Patient agreeable to plan of care.  Patient is feeling much better.  Heart rate is decreased around 100 bpm currently.  We will discharge patient home with PCP follow-up.  Iyla Balzarini was evaluated in Emergency Department on 03/02/2021 for the symptoms described in the history of present illness. She was evaluated in the context of the global COVID-19 pandemic, which necessitated consideration that the patient might be at risk for infection with the SARS-CoV-2 virus that causes COVID-19. Institutional protocols and algorithms that pertain to the evaluation of patients at risk for COVID-19 are in a state of rapid change based on information released by regulatory bodies including the CDC and federal and state organizations. These policies and algorithms were followed during the patient's care in the ED.  ____________________________________________   FINAL CLINICAL IMPRESSION(S) / ED DIAGNOSES  Central chest pain   Harvest Dark,  MD 03/02/21 2052

## 2021-03-02 NOTE — Discharge Instructions (Addendum)
As we discussed it is important you call the number provided for GI medicine to arrange a follow-up appointment to discuss further evaluation and consideration of an endoscopy given the recent finding of likely esophagitis.  Please take your medication as prescribed.  As we discussed please use over-the-counter liquid Maalox as needed for discomfort and before going to bed for the next 7 days.  Your CT scan also showed a splenic artery aneurysm as well as an adrenal gland nodule.  As we discussed these are nonemergent conditions but should be followed up with your doctor for further evaluation.  Return to the emergency department for any return of/worsening chest pain shortness of breath or any other symptom personally concerning to yourself.

## 2021-03-07 DIAGNOSIS — E278 Other specified disorders of adrenal gland: Secondary | ICD-10-CM | POA: Insufficient documentation

## 2021-03-12 ENCOUNTER — Ambulatory Visit: Payer: Medicare Other | Admitting: Dermatology

## 2021-03-13 ENCOUNTER — Ambulatory Visit: Payer: Medicare Other | Admitting: Dermatology

## 2021-03-27 ENCOUNTER — Other Ambulatory Visit: Payer: Self-pay

## 2021-03-27 ENCOUNTER — Ambulatory Visit (INDEPENDENT_AMBULATORY_CARE_PROVIDER_SITE_OTHER): Payer: Medicare Other | Admitting: Vascular Surgery

## 2021-03-27 ENCOUNTER — Encounter (INDEPENDENT_AMBULATORY_CARE_PROVIDER_SITE_OTHER): Payer: Self-pay | Admitting: Vascular Surgery

## 2021-03-27 VITALS — BP 117/70 | HR 109 | Ht 67.0 in | Wt 180.0 lb

## 2021-03-27 DIAGNOSIS — E782 Mixed hyperlipidemia: Secondary | ICD-10-CM

## 2021-03-27 DIAGNOSIS — E785 Hyperlipidemia, unspecified: Secondary | ICD-10-CM | POA: Insufficient documentation

## 2021-03-27 DIAGNOSIS — K219 Gastro-esophageal reflux disease without esophagitis: Secondary | ICD-10-CM | POA: Insufficient documentation

## 2021-03-27 DIAGNOSIS — E1165 Type 2 diabetes mellitus with hyperglycemia: Secondary | ICD-10-CM | POA: Diagnosis not present

## 2021-03-27 DIAGNOSIS — I728 Aneurysm of other specified arteries: Secondary | ICD-10-CM | POA: Insufficient documentation

## 2021-03-27 NOTE — Progress Notes (Signed)
MRN : [id number]  Jasmine Robinson is a 70 y.o. (1951/07/20) female who presents with chief complaint of  Chief Complaint  Patient presents with   New Patient (Initial Visit)    NP 2.3 Splenic artery aneurysm  .  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 The aneurysm was found incidentally by CT scan. Patient denies abdominal pain, flank pain or unusual back pain, no other abdominal complaints.    No family history of aneurysm.   Patient denies amaurosis fugax or TIA symptoms. There is no history of claudication or rest pain symptoms of the lower extremities.  The patient denies angina or shortness of breath.  CT scan shows a splenic artery aneurysm that measures 2.1 cm   Current Meds  Medication Sig   ALPRAZolam (XANAX) 0.25 MG tablet Take 0.25 mg by mouth 2 (two) times daily as needed.   aspirin EC 81 MG tablet Take 81 mg by mouth daily.   baclofen (LIORESAL) 10 MG tablet Take 10 mg by mouth 2 (two) times daily as needed.   Blood Glucose Monitoring Suppl (FIFTY50 GLUCOSE METER 2.0) w/Device KIT Use as directed. ONE TOUCH Dx E11.9   buPROPion (WELLBUTRIN XL) 150 MG 24 hr tablet Take 150 mg by mouth 2 (two) times daily.    Ferrous Sulfate (IRON) 325 (65 FE) MG TABS Take 1 tablet by mouth daily.   glipiZIDE (GLUCOTROL XL) 10 MG 24 hr tablet Take 10 mg by mouth 2 (two) times daily.   glucose blood test strip USE ONE STRIP TO CHECK BLOOD GLUCOSE LEVELS TWICE DAILY AS DIRECTED   JARDIANCE 25 MG TABS tablet    Lancet Devices ([employer] LANCING DEVICE) MISC Use 1 each as directed. Use as instructed.   metFORMIN (GLUCOPHAGE) 1000 MG tablet Take 1,000 mg by mouth 2 (two) times daily.   ONETOUCH DELICA LANCETS 46N MISC    pantoprazole (PROTONIX) 40 MG tablet Take 1 tablet (40 mg total) by mouth daily.   vitamin B-12 (CYANOCOBALAMIN) 500 MCG tablet Take 500 mcg by mouth daily.    Past Medical History:  Diagnosis Date    Depression    Diabetes mellitus    Fatty liver    GERD (gastroesophageal reflux disease)    Sleep apnea     Past Surgical History:  Procedure Laterality Date   ABDOMINAL HYSTERECTOMY     BREAST BIOPSY Right 07/03/13   stereo biopsy/ clip- tophat-neg   KNEE SURGERY  2007   torn meniscus    Social History Social History   Tobacco Use   Smoking status: Never   Smokeless tobacco: Never  Vaping Use   Vaping Use: Never used  Substance Use Topics   Alcohol use: Yes    Comment: occasional    Drug use: No    Family History Family History  Problem Relation Age of Onset   Diabetes Father    Breast cancer Cousin   No family history of bleeding/clotting disorders, porphyria or autoimmune disease   No Known Allergies   REVIEW OF SYSTEMS (Negative unless checked)  Constitutional: '[]' Weight loss  '[]' Fever  '[]' Chills Cardiac: '[]' Chest pain   '[]' Chest pressure   '[]' Palpitations   '[]' Shortness of breath when laying flat   '[]' Shortness of breath with exertion. Vascular:  '[]' Pain in legs with walking   '[]' Pain in legs at rest  '[]' History of DVT   '[]' Phlebitis   '[]' Swelling in legs   '[]' Varicose veins   '[]'   Non-healing ulcers Pulmonary:   '[]' Uses home oxygen   '[]' Productive cough   '[]' Hemoptysis   '[]' Wheeze  '[]' COPD   '[]' Asthma Neurologic:  '[]' Dizziness   '[]' Seizures   '[]' History of stroke   '[]' History of TIA  '[]' Aphasia   '[]' Vissual changes   '[]' Weakness or numbness in arm   '[]' Weakness or numbness in leg Musculoskeletal:   '[]' Joint swelling   '[]' Joint pain   '[]' Low back pain Hematologic:  '[]' Easy bruising  '[]' Easy bleeding   '[]' Hypercoagulable state   '[]' Anemic Gastrointestinal:  '[]' Diarrhea   '[]' Vomiting  '[x]' Gastroesophageal reflux/heartburn   '[]' Difficulty swallowing. Genitourinary:  '[]' Chronic kidney disease   '[]' Difficult urination  '[]' Frequent urination   '[]' Blood in urine Skin:  '[]' Rashes   '[]' Ulcers  Psychological:  '[]' History of anxiety   '[]'  History of major depression.  Physical Examination  Vitals:   03/27/21 1107   BP: 117/70  Pulse: (!) 109  Weight: 180 lb (81.6 kg)  Height: '5\' 7"'  (1.702 m)   Body mass index is 28.19 kg/m. Gen: WD/WN, NAD Head: [place]/AT, No temporalis wasting.  Ear/Nose/Throat: Hearing grossly intact, nares w/o erythema or drainage, poor dentition Eyes: PER, EOMI, sclera nonicteric.  Neck: Supple, no masses.  No bruit or JVD.  Pulmonary:  Good air movement, clear to auscultation bilaterally, no use of accessory muscles.  Cardiac: RRR, normal S1, S2, no Murmurs. Vascular:  Vessel Right Left  Radial Palpable Palpable  PT Palpable Palpable  DP Palpable Palpable  Gastrointestinal: soft, non-distended. No guarding/no peritoneal signs.  Musculoskeletal: M/S 5/5 throughout.  No deformity or atrophy.  Neurologic: CN 2-12 intact. Pain and light touch intact in extremities.  Symmetrical.  Speech is fluent. Motor exam as listed above. Psychiatric: Judgment intact, Mood & affect appropriate for pt's clinical situation. Dermatologic: No rashes or ulcers noted.  No changes consistent with cellulitis.   CBC Lab Results  Component Value Date   WBC 12.3 (H) 03/02/2021   HGB 12.5 03/02/2021   HCT 39.1 03/02/2021   MCV 93.8 03/02/2021   PLT 226 03/02/2021    BMET    Component Value Date/Time   NA 136 03/02/2021 1644   NA 139 10/26/2013 1514   K 5.1 03/02/2021 1644   K 4.4 10/26/2013 1514   CL 102 03/02/2021 1644   CL 107 10/26/2013 1514   CO2 24 03/02/2021 1644   CO2 29 10/26/2013 1514   GLUCOSE 106 (H) 03/02/2021 1644   GLUCOSE 134 (H) 10/26/2013 1514   BUN 22 03/02/2021 1644   BUN 18 10/26/2013 1514   CREATININE 0.94 03/02/2021 1644   CREATININE 0.82 10/26/2013 1514   CALCIUM 9.4 03/02/2021 1644   CALCIUM 9.9 10/26/2013 1514   GFRNONAA >60 03/02/2021 1644   GFRNONAA >60 10/26/2013 1514   GFRAA >60 10/21/2015 1840   GFRAA >60 10/26/2013 1514   CrCl cannot be calculated (Patient's most recent lab result is older than the maximum 21 days allowed.).  COAG No results  found for: INR, PROTIME  Radiology CT Angio Chest PE W and/or Wo Contrast  Result Date: 03/02/2021 CLINICAL DATA:  Intermittent chest pain EXAM: CT ANGIOGRAPHY CHEST WITH CONTRAST TECHNIQUE: Multidetector CT imaging of the chest was performed using the standard protocol during bolus administration of intravenous contrast. Multiplanar CT image reconstructions and MIPs were obtained to evaluate the vascular anatomy. CONTRAST:  12m OMNIPAQUE IOHEXOL 350 MG/ML SOLN COMPARISON:  Chest x-ray 03/02/2021 FINDINGS: Cardiovascular: Satisfactory opacification of the pulmonary arteries to the segmental level. No evidence of pulmonary embolism. Common origin of the left  common carotid and right brachiocephalic arteries. Aorta is nonaneurysmal. Mild aortic atherosclerosis. No dissection is seen. Normal cardiac size. No pericardial effusion Mediastinum/Nodes: Midline trachea. No suspicious thyroid mass. Diffuse circumferential esophageal thickening. Ill-defined soft tissue density in the subcarinal region probably thickened esophagus. No grossly enlarged lymph nodes Lungs/Pleura: Small bilateral pleural effusions. No consolidation or pneumothorax. Upper Abdomen: Probable cyst in the anterior liver. Indeterminate bilateral adrenal masses measuring 17 mm on the left and 28 mm on the right. Possible diverticulum at the gastric cardia. 2.3 Cm splenic artery aneurysm. Musculoskeletal: No acute or suspicious osseous abnormality. Review of the MIP images confirms the above findings. IMPRESSION: 1. Negative for acute pulmonary embolus or aortic dissection. 2. Diffuse circumferential wall thickening of the esophagus, indeterminate for esophagitis, reflux, or a mass. Suggest correlation with endoscopy versus nonemergent esophagram. 3. Small bilateral pleural effusions. 4. 2.3 cm splenic artery aneurysm. Recommend nonemergent interventional radiology consultation 5. Indeterminate bilateral adrenal gland nodules right greater than left.  Further evaluation with outpatient adrenal CT is recommended. Aortic Atherosclerosis (ICD10-I70.0). Electronically Signed   By: Donavan Foil M.D.   On: 03/02/2021 19:50   DG Chest Portable 1 View  Result Date: 03/02/2021 CLINICAL DATA:  Chest pain EXAM: PORTABLE CHEST 1 VIEW COMPARISON:  October 21, 2015 FINDINGS: The cardiomediastinal silhouette is unchanged in contour. No pleural effusion. No pneumothorax. No acute pleuroparenchymal abnormality. Visualized abdomen is unremarkable. Multilevel degenerative changes of the thoracic spine. IMPRESSION: No acute cardiopulmonary abnormality. Electronically Signed   By: Valentino Saxon MD   On: 03/02/2021 17:33     Assessment/Plan 1. Splenic artery aneurysm (HCC) No surgery or intervention at this time. The patient has an asymptomatic splenic artery aneurysm that is less than 4 cm in maximal diameter.  I have discussed the natural history of splenic artery aneurysm and the small risk of rupture for aneurysm less than 2.5 cm in size.  However, as these small aneurysms tend to enlarge over time, continued surveillance with ultrasound or CT scan is mandatory.  I have also discussed optimizing medical management with hypertension and lipid control and the importance of abstinence from tobacco.  The patient is also encouraged to exercise a minimum of 30 minutes 4 times a week.  Should the patient develop new onset abdominal, flank or back pain or signs of peripheral embolization they are instructed to seek medical attention immediately and to alert the physician providing care that they have an splenic artery aneurysm.  The patient voices their understanding. The patient will return in 12 months with an mesenteric duplex.  - VAS [place] MESENTERIC; Future  2. Uncontrolled type 2 diabetes mellitus with hyperglycemia, without long-term current use of insulin (HCC) Continue hypoglycemic medications as already ordered, these medications have been reviewed and there  are no changes at this time.  Hgb A1C to be monitored as already arranged by primary service   3. Gastroesophageal reflux disease, unspecified whether esophagitis present Continue PPI as already ordered, this medication has been reviewed and there are no changes at this time.  Avoidence of caffeine and alcohol  Moderate elevation of the head of the bed    4. Mixed hyperlipidemia Continue statin as ordered and reviewed, no changes at this time     Hortencia Pilar, MD  03/27/2021 11:10 AM

## 2021-04-16 ENCOUNTER — Other Ambulatory Visit: Payer: Self-pay

## 2021-04-16 ENCOUNTER — Ambulatory Visit (INDEPENDENT_AMBULATORY_CARE_PROVIDER_SITE_OTHER): Payer: Medicare Other | Admitting: Dermatology

## 2021-04-16 DIAGNOSIS — L821 Other seborrheic keratosis: Secondary | ICD-10-CM | POA: Diagnosis not present

## 2021-04-16 DIAGNOSIS — L918 Other hypertrophic disorders of the skin: Secondary | ICD-10-CM | POA: Diagnosis not present

## 2021-04-16 DIAGNOSIS — D2361 Other benign neoplasm of skin of right upper limb, including shoulder: Secondary | ICD-10-CM | POA: Diagnosis not present

## 2021-04-16 DIAGNOSIS — L720 Epidermal cyst: Secondary | ICD-10-CM

## 2021-04-16 NOTE — Progress Notes (Signed)
   New Patient Visit  Subjective  Jasmine Robinson is a 70 y.o. female who presents for the following: Skin Problem (Pt c/o moles on her back, chest and face growing and changing color) and Cyst (Check growth on the back growing and irritated by her bra. ). The patient presents for Upper Body Skin Exam (UBSE) for skin cancer screening and mole check.    The following portions of the chart were reviewed this encounter and updated as appropriate:       Review of Systems:  No other skin or systemic complaints except as noted in HPI or Assessment and Plan.  Objective  Well appearing patient in no apparent distress; mood and affect are within normal limits.  All skin waist up examined.  right upper back, spinal mid back, right lateral upper back Firm small blue papules with dilated pores,  1.0 cm pink brown subq nodule with dilated pore at the right lateral upper back   Assessment & Plan  Epidermal cyst right upper back, spinal mid back, right lateral upper back  Benign-appearing. Exam most consistent with an epidermal inclusion cyst. Discussed that a cyst is a benign growth that can grow over time and sometimes get irritated or inflamed. Recommend observation if it is not bothersome. Discussed option of surgical excision to remove it if it is growing, symptomatic, or other changes noted. Please call for new or changing lesions so they can be evaluated.   Cyst with symptoms and/or recent change R lateral back.  Discussed surgical excision to remove, including resulting scar and possible recurrence.  Patient will schedule for surgery. Pre-op information given.    Extracted right lateral upper back- irritated by bra, but will still need excision  Acne/Milia surgery - right upper back, spinal mid back, right lateral upper back Procedure risks and benefits were discussed with the patient and verbal consent was obtained. Following prep of the skin on the right lateral upper back with  an alcohol swab, extraction of comedones was performed with a comedone extractor. Vaseline ointment was applied to each site. The patient tolerated the procedure well.  Seborrheic Keratoses Back, inframammary, abdomen, face   Reassured benign age-related growth.  Recommend observation.  Discussed cryotherapy if spot(s) become irritated or inflamed. But could leave light discolored spots.  - Stuck-on, waxy, tan-brown papules and/or plaques  - Benign-appearing - Discussed benign etiology and prognosis. - Observe - Call for any changes  Dermatofibroma Right upper arm  - Firm pink/brown papulenodule with dimple sign - Benign appearing - Call for any changes   Acrochordons (Skin Tags) Neck  - Fleshy, skin-colored pedunculated papules - Benign appearing.  - Observe. - If desired, they can be removed with an in office procedure that is not covered by insurance. - Please call the clinic if you notice any new or changing lesions.    Return for surgery cyst right lateral upper back .   I, Marye Round, CMA, am acting as scribe for Brendolyn Patty, MD .   Documentation: I have reviewed the above documentation for accuracy and completeness, and I agree with the above.  Brendolyn Patty MD

## 2021-04-16 NOTE — Patient Instructions (Addendum)
Pre-Operative Instructions  You are scheduled for a surgical procedure at [facility]. We recommend you read the following instructions. If you have any questions or concerns, please call the office at [phone].  Shower and wash the entire body with soap and water the day of your surgery paying special attention to cleansing at and around the planned surgery site.  Avoid aspirin or aspirin containing products at least fourteen (14) days prior to your surgical procedure and for at least one week (7 Days) after your surgical procedure. If you take aspirin on a regular basis for heart disease or history of stroke or for any other reason, we may recommend you continue taking aspirin but please notify us if you take this on a regular basis. Aspirin can cause more bleeding to occur during surgery as well as prolonged bleeding and bruising after surgery.   Avoid other nonsteroidal pain medications at least one week prior to surgery and at least one week prior to your surgery. These include medications such as Ibuprofen (Motrin, Advil and Nuprin), Naprosyn, Voltaren, Relafen, etc. If medications are used for therapeutic reasons, please inform us as they can cause increased bleeding or prolonged bleeding during and bruising after surgical procedures.   Please advise [place] if you are taking any "blood thinner" medications such as Coumadin or Dipyridamole or Plavix or similar medications. These cause increased bleeding and prolonged bleeding during procedures and bruising after surgical procedures. We may have to consider discontinuing these medications briefly prior to and shortly after your surgery if safe to do so.   Please inform us of all medications you are currently taking. All medications that are taken regularly should be taken the day of surgery as you always do. Nevertheless, we need to be informed of what medications you are taking prior to surgery to know whether they will affect the  procedure or cause any complications.   Please inform us of any medication allergies. Also inform us of whether you have allergies to Latex or rubber products or whether you have had any adverse reaction to Lidocaine or Epinephrine.  Please inform us of any prosthetic or artificial body parts such as artificial heart valve, joint replacements, etc., or similar condition that might require preoperative antibiotics.   We recommend avoidance of alcohol at least two weeks prior to surgery and continued avoidance for at least two weeks after surgery.   We recommend discontinuation of tobacco smoking at least two weeks prior to surgery and continued abstinence for at least two weeks after surgery.  Do not plan strenuous exercise, strenuous work or strenuous lifting for approximately four weeks after your surgery.   We request if you are unable to make your scheduled surgical appointment, please call us at least a week in advance or as soon as you are aware of a problem so that we can cancel or reschedule the appointment.   You MAY TAKE TYLENOL (acetaminophen) for pain as it is not a blood thinner.   PLEASE PLAN TO BE IN TOWN FOR TWO WEEKS FOLLOWING SURGERY, THIS IS IMPORTANT SO YOU CAN BE CHECKED FOR DRESSING CHANGES, SUTURE REMOVAL AND TO MONITOR FOR POSSIBLE COMPLICATIONS.    Seborrheic Keratosis  What causes seborrheic keratoses? Seborrheic keratoses are harmless, common skin growths that first appear during adult life.  As time goes by, more growths appear.  Some people may develop a large number of them.  Seborrheic keratoses appear on both covered and uncovered body parts.  They are  not caused by sunlight.  The tendency to develop seborrheic keratoses can be inherited.  They vary in color from skin-colored to gray, brown, or even black.  They can be either smooth or have a rough, warty surface.   Seborrheic keratoses are superficial and look as if they were stuck on the skin.  Under the  microscope this type of keratosis looks like layers upon layers of skin.  That is why at times the top layer may seem to fall off, but the rest of the growth remains and re-grows.    Treatment Seborrheic keratoses do not need to be treated, but can easily be removed in the office.  Seborrheic keratoses often cause symptoms when they rub on clothing or jewelry.  Lesions can be in the way of shaving.  If they become inflamed, they can cause itching, soreness, or burning.  Removal of a seborrheic keratosis can be accomplished by freezing, burning, or surgery. If any spot bleeds, scabs, or grows rapidly, please return to have it checked, as these can be an indication of a skin cancer.      If you have any questions or concerns for your doctor, please call our main line at [phone] and press option 4 to reach your doctor's medical assistant. If no one answers, please leave a voicemail as directed and we will return your call as soon as possible. Messages left after 4 pm will be answered the following business day.   You may also send [place] a message via [employer]. We typically respond to MyChart messages within 1-2 business days.  For prescription refills, please ask your pharmacy to contact our office. Our fax number is [phone].  If you have an urgent issue when the clinic is closed that cannot wait until the next business day, you can page your doctor at the number below.    Please note that while we do our best to be available for urgent issues outside of office hours, we are not available 24/7.   If you have an urgent issue and are unable to reach [place], you may choose to seek medical care at your doctor's office, retail clinic, urgent care center, or emergency room.  If you have a medical emergency, please immediately call 911 or go to the emergency department.  Pager Numbers  - Dr. Nehemiah Massed: [phone]  - Dr. Laurence Ferrari: [phone]  - Dr. Nicole Kindred: [phone]  In the event of  inclement weather, please call our main line at [phone] for an update on the status of any delays or closures.  Dermatology Medication Tips: Please keep the boxes that topical medications come in in order to help keep track of the instructions about where and how to use these. Pharmacies typically print the medication instructions only on the boxes and not directly on the medication tubes.   If your medication is too expensive, please contact our office at [phone] option 4 or send [place] a message through [place].   We are unable to tell what your co-pay for medications will be in advance as this is different depending on your insurance coverage. However, we may be able to find a substitute medication at lower cost or fill out paperwork to get insurance to cover a needed medication.   If a prior authorization is required to get your medication covered by your insurance company, please allow [place] 1-2 business days to complete this process.  Drug prices often vary depending on where the prescription is filled and some pharmacies may offer  cheaper prices.  The website www.goodrx.com contains coupons for medications through different pharmacies. The prices here do not account for what the cost may be with help from insurance (it may be cheaper with your insurance), but the website can give you the price if you did not use any insurance.  - You can print the associated coupon and take it with your prescription to the pharmacy.  - You may also stop by our office during regular business hours and pick up a GoodRx coupon card.  - If you need your prescription sent electronically to a different pharmacy, notify our office through [facility] or by phone at [phone] option 4.

## 2021-04-25 ENCOUNTER — Encounter
Admission: RE | Disposition: A | Discharge status: Home / Self Care | Payer: Self-pay | Source: Home / Self Care | Attending: Gastroenterology

## 2021-04-25 ENCOUNTER — Ambulatory Visit
Admission: RE | Admit: 2021-04-25 | Discharge: 2021-04-25 | Disposition: A | Discharge status: Home / Self Care | Payer: Medicare Other | Attending: Gastroenterology | Admitting: Gastroenterology

## 2021-04-25 ENCOUNTER — Ambulatory Visit: Payer: Medicare Other | Admitting: Anesthesiology

## 2021-04-25 ENCOUNTER — Other Ambulatory Visit: Payer: Self-pay

## 2021-04-25 ENCOUNTER — Encounter: Payer: Self-pay | Admitting: *Deleted

## 2021-04-25 DIAGNOSIS — E119 Type 2 diabetes mellitus without complications: Secondary | ICD-10-CM | POA: Diagnosis not present

## 2021-04-25 DIAGNOSIS — K219 Gastro-esophageal reflux disease without esophagitis: Secondary | ICD-10-CM | POA: Diagnosis not present

## 2021-04-25 DIAGNOSIS — Z7984 Long term (current) use of oral hypoglycemic drugs: Secondary | ICD-10-CM | POA: Diagnosis not present

## 2021-04-25 DIAGNOSIS — K314 Gastric diverticulum: Secondary | ICD-10-CM | POA: Diagnosis not present

## 2021-04-25 DIAGNOSIS — Z791 Long term (current) use of non-steroidal anti-inflammatories (NSAID): Secondary | ICD-10-CM | POA: Insufficient documentation

## 2021-04-25 DIAGNOSIS — K449 Diaphragmatic hernia without obstruction or gangrene: Secondary | ICD-10-CM | POA: Diagnosis not present

## 2021-04-25 DIAGNOSIS — Z7982 Long term (current) use of aspirin: Secondary | ICD-10-CM | POA: Insufficient documentation

## 2021-04-25 DIAGNOSIS — Z79899 Other long term (current) drug therapy: Secondary | ICD-10-CM | POA: Insufficient documentation

## 2021-04-25 DIAGNOSIS — K319 Disease of stomach and duodenum, unspecified: Secondary | ICD-10-CM | POA: Diagnosis not present

## 2021-04-25 DIAGNOSIS — R1310 Dysphagia, unspecified: Secondary | ICD-10-CM | POA: Diagnosis present

## 2021-04-25 DIAGNOSIS — R933 Abnormal findings on diagnostic imaging of other parts of digestive tract: Secondary | ICD-10-CM | POA: Diagnosis present

## 2021-04-25 HISTORY — PX: ESOPHAGOGASTRODUODENOSCOPY (EGD) WITH PROPOFOL: SHX5813

## 2021-04-25 LAB — GLUCOSE, CAPILLARY: Glucose-Capillary: 99 mg/dL (ref 70–99)

## 2021-04-25 SURGERY — ESOPHAGOGASTRODUODENOSCOPY (EGD) WITH PROPOFOL
Anesthesia: Monitor Anesthesia Care

## 2021-04-25 MED ORDER — PROPOFOL 10 MG/ML IV BOLUS
INTRAVENOUS | Status: DC | PRN
  Administered 2021-04-25: 60 mg via INTRAVENOUS

## 2021-04-25 MED ORDER — SODIUM CHLORIDE 0.9 % IV SOLN: INTRAVENOUS | Status: DC

## 2021-04-25 MED ORDER — LIDOCAINE HCL (PF) 2 % IJ SOLN
INTRAMUSCULAR | Status: AC
  Filled 2021-04-25: qty 5

## 2021-04-25 MED ORDER — PROPOFOL 500 MG/50ML IV EMUL
INTRAVENOUS | Status: DC | PRN
  Administered 2021-04-25: 100 ug/kg/min via INTRAVENOUS

## 2021-04-25 MED ORDER — LIDOCAINE HCL (CARDIAC) PF 100 MG/5ML IV SOSY
PREFILLED_SYRINGE | INTRAVENOUS | Status: DC | PRN
  Administered 2021-04-25: 80 mg via INTRAVENOUS

## 2021-04-25 NOTE — H&P (Signed)
Outpatient short stay form Pre-procedure 04/25/2021 1:44 PM Jasmine Miyamoto MD, MPH  Primary Physician: Dr. Ginette Pitman  Reason for visit:  Dyspepsia  History of present illness:   70 y/o lady with history of DM II here for EGD for abnormal CT of circumferential distal esophageal thickening. Recently had trip to [place] and subsequently developed dyspeptic symptoms. No significant dysphagia except to metformin occasionally. No blood thinners. No blood thinners. No family history of GI issues.    Current Facility-Administered Medications:    0.9 %  sodium chloride infusion, , Intravenous, Continuous, Payten Beaumier, Hilton Cork, MD, Last Rate: 20 mL/hr at 04/25/21 1316, New Bag at 04/25/21 1316  Medications Prior to Admission  Medication Sig Dispense Refill Last Dose   desvenlafaxine (PRISTIQ) 50 MG 24 hr tablet desvenlafaxine ER 50 mg tablet,extended release 24 hr   04/24/2021   Ferrous Sulfate (IRON) 325 (65 FE) MG TABS Take 1 tablet by mouth daily.   04/24/2021   JARDIANCE 25 MG TABS tablet   5 04/24/2021   pantoprazole (PROTONIX) 40 MG tablet Take 1 tablet (40 mg total) by mouth daily. 30 tablet 1 04/24/2021   Semaglutide,0.25 or 0.5MG/DOS, 2 MG/1.5ML SOPN Inject 0.5 mg weekly   Past Week   vitamin B-12 (CYANOCOBALAMIN) 500 MCG tablet Take 500 mcg by mouth daily.   04/24/2021   ALPRAZolam (XANAX) 0.25 MG tablet Take 0.25 mg by mouth 2 (two) times daily as needed. (Patient not taking: Reported on 04/25/2021)   Not Taking   aspirin EC 81 MG tablet Take 81 mg by mouth daily. (Patient not taking: Reported on 04/25/2021)   Not Taking   baclofen (LIORESAL) 10 MG tablet Take 10 mg by mouth 2 (two) times daily as needed. (Patient not taking: Reported on 04/25/2021)   Not Taking   Blood Glucose Monitoring Suppl (FIFTY50 GLUCOSE METER 2.0) w/Device KIT Use as directed. ONE TOUCH Dx E11.9      buPROPion (WELLBUTRIN XL) 150 MG 24 hr tablet Take 150 mg by mouth 2 (two) times daily.  (Patient not taking: Reported on  04/25/2021)   Not Taking   citalopram (CELEXA) 20 MG tablet Take 20 mg by mouth daily.      glipiZIDE (GLUCOTROL XL) 10 MG 24 hr tablet Take 10 mg by mouth 2 (two) times daily. (Patient not taking: Reported on 04/25/2021)   Not Taking   glucose blood test strip USE ONE STRIP TO CHECK BLOOD GLUCOSE LEVELS TWICE DAILY AS DIRECTED      Lancet Devices ([employer] LANCING DEVICE) MISC Use 1 each as directed. Use as instructed.      meloxicam (MOBIC) 15 MG tablet meloxicam 15 mg tablet      ONETOUCH DELICA LANCETS 42A MISC   5    pioglitazone (ACTOS) 15 MG tablet Take 15 mg by mouth daily.      valACYclovir (VALTREX) 1000 MG tablet Take 500 mg by mouth daily. (Patient not taking: Reported on 03/27/2021)        No Known Allergies   Past Medical History:  Diagnosis Date   Depression    Diabetes mellitus    Fatty liver    GERD (gastroesophageal reflux disease)    Sleep apnea     Review of systems:  Otherwise negative.    Physical Exam  Gen: Alert, oriented. Appears stated age.  HEENT: PERRLA. Lungs: No respiratory distress CV: RRR Abd: soft, benign, no masses Ext: No edema    Planned procedures: Proceed with EGD. The patient understands the nature of  the planned procedure, indications, risks, alternatives and potential complications including but not limited to bleeding, infection, perforation, damage to internal organs and possible oversedation/side effects from anesthesia. The patient agrees and gives consent to proceed.  Please refer to procedure notes for findings, recommendations and patient disposition/instructions.     Jasmine Miyamoto MD, MPH Gastroenterology 04/25/2021  1:44 PM

## 2021-04-25 NOTE — Op Note (Signed)
[facility] Gastroenterology Patient Name: Jasmine Robinson Procedure Date: 04/25/2021 1:39 PM MRN: [id number] Account #: [id number] Date of Birth: [birth year] Admit Type: Outpatient Age: 70 Room: [facility] ENDO ROOM 3 Gender: Female Note Status: Finalized Procedure:             Upper GI endoscopy Indications:           Dysphagia, Gastro-esophageal reflux disease, Abnormal                         CT of the GI tract Providers:             Andrey Farmer MD, MD Medicines:             Monitored Anesthesia Care Complications:         No immediate complications. Estimated blood loss:                         Minimal. Procedure:             Pre-Anesthesia Assessment:                        - Prior to the procedure, a History and Physical was                         performed, and patient medications and allergies were                         reviewed. The patient is competent. The risks and                         benefits of the procedure and the sedation options and                         risks were discussed with the patient. All questions                         were answered and informed consent was obtained.                         Patient identification and proposed procedure were                         verified by the physician, the nurse, the anesthetist                         and the technician in the endoscopy suite. Mental                         Status Examination: alert and oriented. Airway                         Examination: normal oropharyngeal airway and neck                         mobility. Respiratory Examination: clear to                         auscultation. CV Examination: normal. Prophylactic  Antibiotics: The patient does not require prophylactic                         antibiotics. Prior Anticoagulants: The patient has                         taken no previous anticoagulant or antiplatelet                         agents. ASA  Grade Assessment: II - A patient with mild                         systemic disease. After reviewing the risks and                         benefits, the patient was deemed in satisfactory                         condition to undergo the procedure. The anesthesia                         plan was to use monitored anesthesia care (MAC).                         Immediately prior to administration of medications,                         the patient was re-assessed for adequacy to receive                         sedatives. The heart rate, respiratory rate, oxygen                         saturations, blood pressure, adequacy of pulmonary                         ventilation, and response to care were monitored                         throughout the procedure. The physical status of the                         patient was re-assessed after the procedure.                        After obtaining informed consent, the endoscope was                         passed under direct vision. Throughout the procedure,                         the patient's blood pressure, pulse, and oxygen                         saturations were monitored continuously. The Endoscope                         was introduced through the mouth, and advanced to the  second part of duodenum. The upper GI endoscopy was                         accomplished without difficulty. The patient tolerated                         the procedure well. Findings:      A small hiatal hernia was present.      The exam of the esophagus was otherwise normal.      A non-bleeding diverticulum was found in the gastric fundus.      The exam of the stomach was otherwise normal.      Biopsies were taken with a cold forceps in the stomach for Helicobacter       pylori testing. Estimated blood loss was minimal.      The examined duodenum was normal. Biopsies for histology were taken with       a cold forceps for evaluation of celiac  disease. Estimated blood loss       was minimal. Impression:            - Small hiatal hernia.                        - Gastric diverticulum.                        - Normal examined duodenum. Biopsied.                        - Biopsies were taken with a cold forceps for                         Helicobacter pylori testing. Recommendation:        - Discharge patient to home.                        - Resume previous diet.                        - Continue present medications.                        - Await pathology results.                        - Return to referring physician as previously                         scheduled. Procedure Code(s):     --- Professional ---                        [phone], Esophagogastroduodenoscopy, flexible,                         transoral; with biopsy, single or multiple Diagnosis Code(s):     --- Professional ---                        K44.9, Diaphragmatic hernia without obstruction or                         gangrene  K31.4, Gastric diverticulum                        R13.10, Dysphagia, unspecified                        K21.9, Gastro-esophageal reflux disease without                         esophagitis                        R93.3, Abnormal findings on diagnostic imaging of                         other parts of digestive tract CPT copyright 2019 American Medical Association. All rights reserved. The codes documented in this report are preliminary and upon coder review may  be revised to meet current compliance requirements. Andrey Farmer MD, MD 04/25/2021 2:06:29 PM Number of Addenda: 0 Note Initiated On: 04/25/2021 1:39 PM Estimated Blood Loss:  Estimated blood loss was minimal.      [facility]

## 2021-04-25 NOTE — Interval H&P Note (Signed)
History and Physical Interval Note:  04/25/2021 1:48 PM  Conley Simmonds Ternisha Olander  has presented today for surgery, with the diagnosis of Dysphagia.  The various methods of treatment have been discussed with the patient and family. After consideration of risks, benefits and other options for treatment, the patient has consented to  Procedure(s): ESOPHAGOGASTRODUODENOSCOPY (EGD) WITH PROPOFOL (N/A) as a surgical intervention.  The patient's history has been reviewed, patient examined, no change in status, stable for surgery.  I have reviewed the patient's chart and labs.  Questions were answered to the patient's satisfaction.     Lesly Rubenstein  Ok to proceed with EGD

## 2021-04-25 NOTE — Transfer of Care (Signed)
Immediate Anesthesia Transfer of Care Note  Patient: Jasmine Robinson  Procedure(s) Performed: ESOPHAGOGASTRODUODENOSCOPY (EGD) WITH PROPOFOL  Patient Location: PACU  Anesthesia Type:MAC  Level of Consciousness: drowsy  Airway & Oxygen Therapy: Patient Spontanous Breathing  Post-op Assessment: Report given to RN and Post -op Vital signs reviewed and stable  Post vital signs: Reviewed and stable  Last Vitals:  Vitals Value Taken Time  BP 127/76 04/25/21 1406  Temp    Pulse 94 04/25/21 1406  Resp 15 04/25/21 1406  SpO2 97 % 04/25/21 1406  Vitals shown include unvalidated device data.  Last Pain:  Vitals:   04/25/21 1302  TempSrc: Temporal  PainSc: 0-No pain         Complications: No notable events documented.

## 2021-04-26 NOTE — Anesthesia Postprocedure Evaluation (Signed)
Anesthesia Post Note  Patient: Jasmine Robinson  Procedure(s) Performed: ESOPHAGOGASTRODUODENOSCOPY (EGD) WITH PROPOFOL  Patient location during evaluation: PACU Anesthesia Type: MAC Level of consciousness: awake and alert Pain management: pain level controlled Vital Signs Assessment: post-procedure vital signs reviewed and stable Respiratory status: spontaneous breathing, nonlabored ventilation, respiratory function stable and patient connected to nasal cannula oxygen Cardiovascular status: stable and blood pressure returned to baseline Postop Assessment: no apparent nausea or vomiting Anesthetic complications: no   No notable events documented.   Last Vitals:  Vitals:   04/25/21 1415 04/25/21 1425  BP: 126/77 127/71  Pulse: 88 79  Resp: 11 (!) 9  Temp:    SpO2: 100% 100%    Last Pain:  Vitals:   04/26/21 1356  TempSrc:   PainSc: 0-No pain                 Tonny Bollman

## 2021-04-26 NOTE — Anesthesia Preprocedure Evaluation (Addendum)
Anesthesia Evaluation  Patient identified by MRN, date of birth, ID band Patient awake    Reviewed: Allergy & Precautions, NPO status , Patient's Chart, lab work & pertinent test results  History of Anesthesia Complications Negative for: history of anesthetic complications  Airway Mallampati: II  TM Distance: >3 FB Neck ROM: Full    Dental no notable dental hx.    Pulmonary neg pulmonary ROS,    Pulmonary exam normal breath sounds clear to auscultation       Cardiovascular Exercise Tolerance: Good METS: 3 - Mets negative cardio ROS Normal cardiovascular exam Rhythm:Regular Rate:Normal     Neuro/Psych negative neurological ROS  negative psych ROS   GI/Hepatic negative GI ROS, Neg liver ROS,   Endo/Other  negative endocrine ROSdiabetes  Renal/GU negative Renal ROS  negative genitourinary   Musculoskeletal negative musculoskeletal ROS (+)   Abdominal   Peds  Hematology negative hematology ROS (+)   Anesthesia Other Findings   Reproductive/Obstetrics negative OB ROS                             Anesthesia Physical Anesthesia Plan  ASA: 2  Anesthesia Plan: MAC   Post-op Pain Management:    Induction: Intravenous  PONV Risk Score and Plan:   Airway Management Planned: Natural Airway and Nasal Cannula  Additional Equipment:   Intra-op Plan:   Post-operative Plan:   Informed Consent: I have reviewed the patients History and Physical, chart, labs and discussed the procedure including the risks, benefits and alternatives for the proposed anesthesia with the patient or authorized representative who has indicated his/her understanding and acceptance.     Dental Advisory Given  Plan Discussed with: Anesthesiologist, CRNA and Surgeon  Anesthesia Plan Comments: (Patient consented for risks of anesthesia including but not limited to:  - adverse reactions to medications - damage to  eyes, teeth, lips or other oral mucosa - nerve damage due to positioning  - sore throat or hoarseness - Damage to heart, brain, nerves, lungs, other parts of body or loss of life  Patient voiced understanding.)        Anesthesia Quick Evaluation

## 2021-04-28 ENCOUNTER — Encounter: Payer: Self-pay | Admitting: Gastroenterology

## 2021-04-30 LAB — SURGICAL PATHOLOGY

## 2021-06-02 ENCOUNTER — Ambulatory Visit (INDEPENDENT_AMBULATORY_CARE_PROVIDER_SITE_OTHER): Payer: Medicare Other | Admitting: Dermatology

## 2021-06-02 ENCOUNTER — Other Ambulatory Visit: Payer: Self-pay

## 2021-06-02 DIAGNOSIS — L720 Epidermal cyst: Secondary | ICD-10-CM | POA: Diagnosis not present

## 2021-06-02 DIAGNOSIS — D485 Neoplasm of uncertain behavior of skin: Secondary | ICD-10-CM | POA: Diagnosis not present

## 2021-06-02 DIAGNOSIS — D492 Neoplasm of unspecified behavior of bone, soft tissue, and skin: Secondary | ICD-10-CM

## 2021-06-02 NOTE — Progress Notes (Signed)
   Follow-Up Visit   Subjective  Jasmine Robinson is a 70 y.o. female who presents for the following: Cyst (R lat mid upper back, pt presents for excision of cyst) and inflamed cyst vs other (Mid back spinal, itchy).   The following portions of the chart were reviewed this encounter and updated as appropriate:       Review of Systems:  No other skin or systemic complaints except as noted in HPI or Assessment and Plan.  Objective  Well appearing patient in no apparent distress; mood and affect are within normal limits.  A focused examination was performed including back. Relevant physical exam findings are noted in the Assessment and Plan.  Right lateral mid upper back Firm hyperpigmented nodule with punctum 1.4 x 0.9cm  Mid Back spinal Hyperpigmented firm crusted papules (pt has been scratching areas)   Assessment & Plan  Neoplasm of skin Right lateral mid upper back  Skin excision  Lesion length (cm):  1.4 Lesion width (cm):  0.9 Margin per side (cm):  0.3 Total excision diameter (cm):  2 Informed consent: discussed and consent obtained   Timeout: patient name, date of birth, surgical site, and procedure verified   Procedure prep:  Patient was prepped and draped in usual sterile fashion Prep type:  Povidone-iodine Anesthesia: the lesion was anesthetized in a standard fashion   Anesthetic:  1% lidocaine w/ epinephrine 1-100,000 buffered w/ 8.4% NaHCO3 (6cc lido, 6cc bupivicaine, toal of 12cc) Instrument used: #15 blade   Hemostasis achieved with: pressure and electrodesiccation   Outcome: patient tolerated procedure well with no complications    Skin repair Complexity:  Intermediate Final length (cm):  2.6 Informed consent: discussed and consent obtained   Timeout: patient name, date of birth, surgical site, and procedure verified   Reason for type of repair: reduce tension to allow closure, reduce the risk of dehiscence, infection, and necrosis, reduce  subcutaneous dead space and avoid a hematoma, preserve normal anatomical and functional relationships and enhance both functionality and cosmetic results   Undermining: edges undermined   Subcutaneous layers (deep stitches):  Suture size:  3-0 Suture type: Vicryl (polyglactin 910)   Subcutaneous suture technique: inverted dermal. Fine/surface layer approximation (top stitches):  Suture size:  3-0 Suture type: nylon   Stitches: simple interrupted   Suture removal (days):  7 Hemostasis achieved with: suture Outcome: patient tolerated procedure well with no complications   Post-procedure details: sterile dressing applied and wound care instructions given   Dressing type: pressure dressing (Mupirocin ointment)    Specimen 1 - Surgical pathology Differential Diagnosis: D48.5 Cyst vs other  Check Margins: No Firm hyperpigmented nodule with punctum 1.4 x 0.9cm  Epidermal cyst Mid Back spinal  Inflamed  Start Impoyz cr bid samples x 3 given to pt  Recheck on f/up and will evaluate if need excision  Return in about 8 days (around 06/10/2021) for suture removal.  I, Sonya Hupman, RMA, am acting as scribe for Brendolyn Patty, MD .  Documentation: I have reviewed the above documentation for accuracy and completeness, and I agree with the above.  Brendolyn Patty MD

## 2021-06-02 NOTE — Patient Instructions (Signed)
Wound Care Instructions  Cleanse wound gently with soap and water once a day then pat dry with clean gauze. Apply a thing coat of Petrolatum (petroleum jelly, "Vaseline") over the wound (unless you have an allergy to this). We recommend that you use a new, sterile tube of Vaseline. Do not pick or remove scabs. Do not remove the yellow or white "healing tissue" from the base of the wound.  Cover the wound with fresh, clean, nonstick gauze and secure with paper tape. You may use Band-Aids in place of gauze and tape if the would is small enough, but would recommend trimming much of the tape off as there is often too much. Sometimes Band-Aids can irritate the skin.  You should call the office for your biopsy report after 1 week if you have not already been contacted.  If you experience any problems, such as abnormal amounts of bleeding, swelling, significant bruising, significant pain, or evidence of infection, please call the office immediately.  FOR ADULT SURGERY PATIENTS: If you need something for pain relief you may take 1 extra strength Tylenol (acetaminophen) AND 2 Ibuprofen (200mg each) together every 4 hours as needed for pain. (do not take these if you are allergic to them or if you have a reason you should not take them.) Typically, you may only need pain medication for 1 to 3 days.   If you have any questions or concerns for your doctor, please call our main line at [phone] and press option 4 to reach your doctor's medical assistant. If no one answers, please leave a voicemail as directed and we will return your call as soon as possible. Messages left after 4 pm will be answered the following business day.   You may also send us a message via MyChart. We typically respond to MyChart messages within 1-2 business days.  For prescription refills, please ask your pharmacy to contact our office. Our fax number is [phone].  If you have an urgent issue when the clinic is closed that  cannot wait until the next business day, you can page your doctor at the number below.    Please note that while we do our best to be available for urgent issues outside of office hours, we are not available 24/7.   If you have an urgent issue and are unable to reach us, you may choose to seek medical care at your doctor's office, retail clinic, urgent care center, or emergency room.  If you have a medical emergency, please immediately call 911 or go to the emergency department.  Pager Numbers  - Dr. Kowalski: [phone]  - Dr. Moye: [phone]  - Dr. Stewart: [phone]  In the event of inclement weather, please call our main line at [phone] for an update on the status of any delays or closures.  Dermatology Medication Tips: Please keep the boxes that topical medications come in in order to help keep track of the instructions about where and how to use these. Pharmacies typically print the medication instructions only on the boxes and not directly on the medication tubes.   If your medication is too expensive, please contact our office at [phone] option 4 or send us a message through MyChart.   We are unable to tell what your co-pay for medications will be in advance as this is different depending on your insurance coverage. However, we may be able to find a substitute medication at lower cost or fill out paperwork to get insurance to cover a needed   medication.   If a prior authorization is required to get your medication covered by your insurance company, please allow us 1-2 business days to complete this process.  Drug prices often vary depending on where the prescription is filled and some pharmacies may offer cheaper prices.  The website www.goodrx.com contains coupons for medications through different pharmacies. The prices here do not account for what the cost may be with help from insurance (it may be cheaper with your insurance), but the website can give you the  price if you did not use any insurance.  - You can print the associated coupon and take it with your prescription to the pharmacy.  - You may also stop by our office during regular business hours and pick up a GoodRx coupon card.  - If you need your prescription sent electronically to a different pharmacy, notify our office through [facility] MyChart or by phone at [phone] option 4.   

## 2021-06-03 ENCOUNTER — Telehealth: Payer: Self-pay

## 2021-06-03 NOTE — Telephone Encounter (Signed)
Pt doing fine after yesterdays surgery./sh 

## 2021-06-10 ENCOUNTER — Ambulatory Visit (INDEPENDENT_AMBULATORY_CARE_PROVIDER_SITE_OTHER): Payer: Medicare Other | Admitting: Dermatology

## 2021-06-10 ENCOUNTER — Other Ambulatory Visit: Payer: Self-pay

## 2021-06-10 DIAGNOSIS — L720 Epidermal cyst: Secondary | ICD-10-CM

## 2021-06-10 NOTE — Progress Notes (Signed)
   Follow-Up Visit   Subjective  Jasmine Robinson is a 70 y.o. female who presents for the following: Post op (R lateral mid upper back. FIBROSIS, NO EVIDENCE OF MALIGNANCY/ /Benign scar tissue from inflamed (now involuted) cyst).   The following portions of the chart were reviewed this encounter and updated as appropriate:       Review of Systems:  No other skin or systemic complaints except as noted in HPI or Assessment and Plan.  Objective  Well appearing patient in no apparent distress; mood and affect are within normal limits.  A focused examination was performed including back. Relevant physical exam findings are noted in the Assessment and Plan.  Right Upper Back Well-healing excision site.   Assessment & Plan  Epidermal inclusion cyst Right Upper Back  FIBROSIS, NO EVIDENCE OF MALIGNANCY   Benign scar tissue from inflamed (now involuted) cyst  Wound cleansed, sutures removed, wound cleansed and steri strips applied. Discussed pathology results.   Return if symptoms worsen or fail to improve.  IJamesetta Orleans, CMA, am acting as scribe for Brendolyn Patty, MD . Documentation: I have reviewed the above documentation for accuracy and completeness, and I agree with the above.  Brendolyn Patty MD

## 2021-06-10 NOTE — Patient Instructions (Signed)

## 2021-06-16 ENCOUNTER — Other Ambulatory Visit: Payer: Self-pay | Admitting: Internal Medicine

## 2021-06-16 DIAGNOSIS — Z1231 Encounter for screening mammogram for malignant neoplasm of breast: Secondary | ICD-10-CM

## 2021-07-22 ENCOUNTER — Ambulatory Visit
Admission: RE | Admit: 2021-07-22 | Discharge: 2021-07-22 | Disposition: A | Discharge status: Home / Self Care | Payer: Medicare Other | Source: Ambulatory Visit | Attending: Internal Medicine | Admitting: Internal Medicine

## 2021-07-22 ENCOUNTER — Other Ambulatory Visit: Payer: Self-pay

## 2021-07-22 DIAGNOSIS — Z1231 Encounter for screening mammogram for malignant neoplasm of breast: Secondary | ICD-10-CM | POA: Insufficient documentation

## 2021-07-23 ENCOUNTER — Ambulatory Visit: Payer: Medicare Other | Admitting: Dermatology

## 2021-09-23 ENCOUNTER — Other Ambulatory Visit (INDEPENDENT_AMBULATORY_CARE_PROVIDER_SITE_OTHER): Payer: Self-pay | Admitting: Vascular Surgery

## 2021-09-23 DIAGNOSIS — I728 Aneurysm of other specified arteries: Secondary | ICD-10-CM

## 2021-09-25 ENCOUNTER — Ambulatory Visit (INDEPENDENT_AMBULATORY_CARE_PROVIDER_SITE_OTHER): Payer: Medicare Other | Admitting: Nurse Practitioner

## 2021-09-25 ENCOUNTER — Encounter (INDEPENDENT_AMBULATORY_CARE_PROVIDER_SITE_OTHER): Payer: Medicare Other

## 2021-10-01 ENCOUNTER — Ambulatory Visit (INDEPENDENT_AMBULATORY_CARE_PROVIDER_SITE_OTHER): Payer: Medicare Other | Admitting: Nurse Practitioner

## 2021-10-01 ENCOUNTER — Ambulatory Visit (INDEPENDENT_AMBULATORY_CARE_PROVIDER_SITE_OTHER): Payer: Medicare Other

## 2021-10-01 ENCOUNTER — Encounter (INDEPENDENT_AMBULATORY_CARE_PROVIDER_SITE_OTHER): Payer: Self-pay | Admitting: Nurse Practitioner

## 2021-10-01 ENCOUNTER — Other Ambulatory Visit: Payer: Self-pay

## 2021-10-01 VITALS — BP 131/76 | HR 97 | Resp 16 | Wt 181.0 lb

## 2021-10-01 DIAGNOSIS — E1165 Type 2 diabetes mellitus with hyperglycemia: Secondary | ICD-10-CM | POA: Diagnosis not present

## 2021-10-01 DIAGNOSIS — E782 Mixed hyperlipidemia: Secondary | ICD-10-CM

## 2021-10-01 DIAGNOSIS — I728 Aneurysm of other specified arteries: Secondary | ICD-10-CM

## 2021-10-12 ENCOUNTER — Encounter (INDEPENDENT_AMBULATORY_CARE_PROVIDER_SITE_OTHER): Payer: Self-pay | Admitting: Nurse Practitioner

## 2021-10-12 NOTE — Progress Notes (Addendum)
Subjective:    Patient ID: Jasmine Robinson, female    DOB: 1951/04/23, 71 y.o.   MRN: [id number] Chief Complaint  Patient presents with   Follow-up    Ultrasound follow up    The patient presents to the office for evaluation of a splenic artery aneurysm. The aneurysm was found incidentally by CT scan. Patient denies abdominal pain, flank pain or unusual back pain, no other abdominal complaints.     No family history of aneurysm.    Patient denies amaurosis fugax or TIA symptoms. There is no history of claudication or rest pain symptoms of the lower extremities.  The patient denies angina or shortness of breath.   CT scan shows a splenic artery aneurysm that measures 2.1 cm.  Ultrasound evaluation today does not show evidence of a splenic artery aneurysm.  This however does not rule out the presence of an aneurysm as it may have been too small to visualize on ultrasound.  There is also no evidence of abdominal aortic aneurysm or stenosis of other visceral arteries.   Review of Systems  Gastrointestinal:  Negative for abdominal pain.  All other systems reviewed and are negative.     Objective:   Physical Exam Vitals reviewed.  HENT:     Head: Normocephalic.  Cardiovascular:     Rate and Rhythm: Normal rate.  Pulmonary:     Effort: Pulmonary effort is normal.  Skin:    General: Skin is warm and dry.  Neurological:     Mental Status: She is alert and oriented to person, place, and time.  Psychiatric:        Mood and Affect: Mood normal.        Behavior: Behavior normal.        Thought Content: Thought content normal.        Judgment: Judgment normal.    BP 131/76 (BP Location: Right Arm)    Pulse 97    Resp 16    Wt 181 lb (82.1 kg)    BMI 28.35 kg/m   Past Medical History:  Diagnosis Date   Depression    Diabetes mellitus    Fatty liver    GERD (gastroesophageal reflux disease)    Sleep apnea     Social History   Socioeconomic History   Marital status:  Married    Spouse name: Not on file   Number of children: Not on file   Years of education: Not on file   Highest education level: Not on file  Occupational History   Not on file  Tobacco Use   Smoking status: Never   Smokeless tobacco: Never  Vaping Use   Vaping Use: Never used  Substance and Sexual Activity   Alcohol use: Yes    Comment: occasional    Drug use: No   Sexual activity: Not on file  Other Topics Concern   Not on file  Social History Narrative   Not on file   Social Determinants of Health   Financial Resource Strain: Not on file  Food Insecurity: Not on file  Transportation Needs: Not on file  Physical Activity: Not on file  Stress: Not on file  Social Connections: Not on file  Intimate Partner Violence: Not on file    Past Surgical History:  Procedure Laterality Date   ABDOMINAL HYSTERECTOMY     BREAST BIOPSY Right 07/03/13   stereo biopsy/ clip- tophat-neg   ESOPHAGOGASTRODUODENOSCOPY (EGD) WITH PROPOFOL N/A 04/25/2021   Procedure: ESOPHAGOGASTRODUODENOSCOPY (EGD) WITH  PROPOFOL;  Surgeon: Lesly Rubenstein, MD;  Location: [facility] ENDOSCOPY;  Service: Endoscopy;  Laterality: N/A;   KNEE SURGERY  2007   torn meniscus    Family History  Problem Relation Age of Onset   Diabetes Father    Breast cancer Cousin     No Known Allergies  CBC Latest Ref Rng & Units 03/02/2021 10/21/2015 05/27/2015  WBC 4.0 - 10.5 K/uL 12.3(H) 11.9(H) 9.4  Hemoglobin 12.0 - 15.0 g/dL 12.5 12.4 12.1  Hematocrit 36.0 - 46.0 % 39.1 38.5 36.9  Platelets 150 - 400 K/uL 226 207 201      CMP     Component Value Date/Time   NA 136 03/02/2021 1644   NA 139 10/26/2013 1514   K 5.1 03/02/2021 1644   K 4.4 10/26/2013 1514   CL 102 03/02/2021 1644   CL 107 10/26/2013 1514   CO2 24 03/02/2021 1644   CO2 29 10/26/2013 1514   GLUCOSE 106 (H) 03/02/2021 1644   GLUCOSE 134 (H) 10/26/2013 1514   BUN 22 03/02/2021 1644   BUN 18 10/26/2013 1514   CREATININE 0.94 03/02/2021 1644    CREATININE 0.82 10/26/2013 1514   CALCIUM 9.4 03/02/2021 1644   CALCIUM 9.9 10/26/2013 1514   PROT 7.6 03/02/2021 1644   PROT 8.1 10/26/2013 1514   ALBUMIN 4.1 03/02/2021 1644   ALBUMIN 3.9 10/26/2013 1514   AST 20 03/02/2021 1644   AST 30 10/26/2013 1514   ALT 13 03/02/2021 1644   ALT 52 10/26/2013 1514   ALKPHOS 109 03/02/2021 1644   ALKPHOS 140 (H) 10/26/2013 1514   BILITOT 0.8 03/02/2021 1644   BILITOT 0.2 10/26/2013 1514   GFRNONAA >60 03/02/2021 1644   GFRNONAA >60 10/26/2013 1514   GFRAA >60 10/21/2015 1840   GFRAA >60 10/26/2013 1514     No results found.     Assessment & Plan:   1. Splenic artery aneurysm (HCC) Today mesenteric ultrasound did not detect evidence of a splenic artery aneurysm.  We will plan on having the patient follow-up in 6 months with CT scan to evaluate for possible growth of splenic artery aneurysm.  Discussed with patient that as the splenic artery aneurysm is less than 2.5 cm in size, there is risk of rupture is very small.  However because these tend to enlarge over time continued surveillance with a CT scan is mandatory.  I have also discussed optimizing medical management with hypertension and lipid control and the importance of abstinence from tobacco.  The patient is also encouraged to exercise a minimum of 30 minutes 4 times a week.  Should the patient develop new onset abdominal, flank or back pain or signs of peripheral embolization they are instructed to seek medical attention immediately and to alert the physician providing care that they have an splenic artery aneurysm.  - CT Angio Abd/Pel w/ and/or w/o; Future  2. Uncontrolled type 2 diabetes mellitus with hyperglycemia, without long-term current use of insulin (HCC) Continue hypoglycemic medications as already ordered, these medications have been reviewed and there are no changes at this time.  Hgb A1C to be monitored as already arranged by primary service   3. Mixed  hyperlipidemia Continue statin as ordered and reviewed, no changes at this time    Current Outpatient Medications on File Prior to Visit  Medication Sig Dispense Refill   atorvastatin (LIPITOR) 10 MG tablet Take 10 mg by mouth daily.     Blood Glucose Monitoring Suppl (FIFTY50 GLUCOSE METER 2.0) w/Device  KIT Use as directed. ONE TOUCH Dx E11.9     citalopram (CELEXA) 20 MG tablet Take 20 mg by mouth daily.     desvenlafaxine (PRISTIQ) 50 MG 24 hr tablet desvenlafaxine ER 50 mg tablet,extended release 24 hr     Ferrous Sulfate (IRON) 325 (65 FE) MG TABS Take 1 tablet by mouth daily.     glucose blood test strip USE ONE STRIP TO CHECK BLOOD GLUCOSE LEVELS TWICE DAILY AS DIRECTED     JARDIANCE 25 MG TABS tablet   5   Lancet Devices ([employer] LANCING DEVICE) MISC Use 1 each as directed. Use as instructed.     lisinopril (ZESTRIL) 5 MG tablet Take 5 mg by mouth daily.     metFORMIN (GLUCOPHAGE) 1000 MG tablet Take 1 tablet by mouth 2 (two) times daily.     ONETOUCH DELICA LANCETS 57W MISC   5   Semaglutide,0.25 or 0.5MG/DOS, 2 MG/1.5ML SOPN Inject 0.5 mg weekly     vitamin B-12 (CYANOCOBALAMIN) 500 MCG tablet Take 500 mcg by mouth daily.     ALPRAZolam (XANAX) 0.25 MG tablet Take 0.25 mg by mouth 2 (two) times daily as needed. (Patient not taking: Reported on 04/25/2021)     aspirin EC 81 MG tablet Take 81 mg by mouth daily. (Patient not taking: Reported on 04/25/2021)     baclofen (LIORESAL) 10 MG tablet Take 10 mg by mouth 2 (two) times daily as needed. (Patient not taking: Reported on 04/25/2021)     buPROPion (WELLBUTRIN XL) 150 MG 24 hr tablet Take 150 mg by mouth 2 (two) times daily.  (Patient not taking: Reported on 04/25/2021)     glipiZIDE (GLUCOTROL XL) 10 MG 24 hr tablet Take 10 mg by mouth 2 (two) times daily. (Patient not taking: Reported on 04/25/2021)     meloxicam (MOBIC) 15 MG tablet meloxicam 15 mg tablet     pantoprazole (PROTONIX) 40 MG tablet Take 1 tablet (40 mg total) by mouth daily.  (Patient not taking: Reported on 10/01/2021) 30 tablet 1   pioglitazone (ACTOS) 15 MG tablet Take 15 mg by mouth daily.     valACYclovir (VALTREX) 1000 MG tablet Take 500 mg by mouth daily. (Patient not taking: Reported on 03/27/2021)     No current facility-administered medications on file prior to visit.    There are no Patient Instructions on file for this visit. No follow-ups on file.   Kris Hartmann, NP

## 2022-01-07 ENCOUNTER — Encounter: Payer: Self-pay | Admitting: Ophthalmology

## 2022-01-12 NOTE — Discharge Instructions (Signed)

## 2022-01-13 ENCOUNTER — Ambulatory Visit: Payer: Medicare Other | Admitting: Anesthesiology

## 2022-01-13 ENCOUNTER — Other Ambulatory Visit: Payer: Self-pay

## 2022-01-13 ENCOUNTER — Encounter
Admission: RE | Disposition: A | Discharge status: Home / Self Care | Payer: Self-pay | Source: Home / Self Care | Attending: Ophthalmology

## 2022-01-13 ENCOUNTER — Encounter: Payer: Self-pay | Admitting: Ophthalmology

## 2022-01-13 ENCOUNTER — Ambulatory Visit
Admission: RE | Admit: 2022-01-13 | Discharge: 2022-01-13 | Disposition: A | Discharge status: Home / Self Care | Payer: Medicare Other | Attending: Ophthalmology | Admitting: Ophthalmology

## 2022-01-13 DIAGNOSIS — E1136 Type 2 diabetes mellitus with diabetic cataract: Secondary | ICD-10-CM | POA: Diagnosis present

## 2022-01-13 DIAGNOSIS — Z833 Family history of diabetes mellitus: Secondary | ICD-10-CM | POA: Insufficient documentation

## 2022-01-13 DIAGNOSIS — H2511 Age-related nuclear cataract, right eye: Secondary | ICD-10-CM | POA: Insufficient documentation

## 2022-01-13 DIAGNOSIS — K219 Gastro-esophageal reflux disease without esophagitis: Secondary | ICD-10-CM | POA: Diagnosis not present

## 2022-01-13 DIAGNOSIS — G4730 Sleep apnea, unspecified: Secondary | ICD-10-CM | POA: Insufficient documentation

## 2022-01-13 HISTORY — DX: Headache, unspecified: R51.9

## 2022-01-13 HISTORY — DX: Anemia, unspecified: D64.9

## 2022-01-13 HISTORY — PX: CATARACT EXTRACTION W/PHACO: SHX586

## 2022-01-13 LAB — GLUCOSE, CAPILLARY
Glucose-Capillary: 109 mg/dL — ABNORMAL HIGH (ref 70–99)
Glucose-Capillary: 106 mg/dL — ABNORMAL HIGH (ref 70–99)

## 2022-01-13 SURGERY — PHACOEMULSIFICATION, CATARACT, WITH IOL INSERTION
Anesthesia: Monitor Anesthesia Care | Site: Eye | Laterality: Right

## 2022-01-13 MED ORDER — FENTANYL CITRATE (PF) 100 MCG/2ML IJ SOLN
INTRAMUSCULAR | Status: DC | PRN
Start: 2022-01-13 — End: 2022-01-13
  Administered 2022-01-13: 50 ug via INTRAVENOUS

## 2022-01-13 MED ORDER — BRIMONIDINE TARTRATE-TIMOLOL 0.2-0.5 % OP SOLN
OPHTHALMIC | Status: DC | PRN
  Administered 2022-01-13: 1 [drp] via OPHTHALMIC

## 2022-01-13 MED ORDER — EPINEPHRINE PF 1 MG/ML IJ SOLN
INTRAMUSCULAR | Status: DC | PRN
  Administered 2022-01-13: 53 mL via OPHTHALMIC

## 2022-01-13 MED ORDER — ARMC OPHTHALMIC DILATING DROPS
1.0000 "application " | OPHTHALMIC | Status: DC | PRN
  Administered 2022-01-13 (×3): 1 via OPHTHALMIC

## 2022-01-13 MED ORDER — LACTATED RINGERS IV SOLN: INTRAVENOUS | Status: DC

## 2022-01-13 MED ORDER — MOXIFLOXACIN HCL 0.5 % OP SOLN
OPHTHALMIC | Status: DC | PRN
  Administered 2022-01-13: 0.2 mL via OPHTHALMIC

## 2022-01-13 MED ORDER — SIGHTPATH DOSE#1 BSS IO SOLN
INTRAOCULAR | Status: DC | PRN
  Administered 2022-01-13: 15 mL

## 2022-01-13 MED ORDER — TETRACAINE HCL 0.5 % OP SOLN
1.0000 [drp] | OPHTHALMIC | Status: DC | PRN
Start: 2022-01-13 — End: 2022-01-13
  Administered 2022-01-13 (×3): 1 [drp] via OPHTHALMIC

## 2022-01-13 MED ORDER — SIGHTPATH DOSE#1 NA CHONDROIT SULF-NA HYALURON 40-17 MG/ML IO SOLN
INTRAOCULAR | Status: DC | PRN
  Administered 2022-01-13: 1 mL via INTRAOCULAR

## 2022-01-13 MED ORDER — SIGHTPATH DOSE#1 BSS IO SOLN
INTRAOCULAR | Status: DC | PRN
  Administered 2022-01-13: 1 mL via INTRAMUSCULAR

## 2022-01-13 MED ORDER — MIDAZOLAM HCL 2 MG/2ML IJ SOLN
INTRAMUSCULAR | Status: DC | PRN
  Administered 2022-01-13 (×2): 1 mg via INTRAVENOUS

## 2022-01-13 SURGICAL SUPPLY — 10 items
CATARACT SUITE SIGHTPATH (MISCELLANEOUS) ×2 IMPLANT
FEE CATARACT SUITE SIGHTPATH (MISCELLANEOUS) ×2 IMPLANT
GLOVE SURG ENC TEXT LTX SZ8 (GLOVE) ×3 IMPLANT
GLOVE SURG TRIUMPH 8.0 PF LTX (GLOVE) ×3 IMPLANT
LENS IOL TECNIS EYHANCE 21.5 (Intraocular Lens) ×1 IMPLANT
NDL FILTER BLUNT 18X1 1/2 (NEEDLE) ×2 IMPLANT
NEEDLE FILTER BLUNT 18X 1/2SAF (NEEDLE) ×1
NEEDLE FILTER BLUNT 18X1 1/2 (NEEDLE) ×1 IMPLANT
SYR 3ML LL SCALE MARK (SYRINGE) ×3 IMPLANT
WATER STERILE IRR 250ML POUR (IV SOLUTION) ×3 IMPLANT

## 2022-01-13 NOTE — Op Note (Signed)
PREOPERATIVE DIAGNOSIS:  Nuclear sclerotic cataract of the right eye. ?  ?POSTOPERATIVE DIAGNOSIS:  H25.11 Cataract ?  ?OPERATIVE PROCEDURE:ORPROCALL@ ?  ?SURGEON:  Birder Robson, MD. ?  ?ANESTHESIA: ? ?Anesthesiologist: Ronelle Nigh, MD ?CRNA: Silvana Newness, CRNA ? ?1.      Managed anesthesia care. ?2.      0.75m of Shugarcaine was instilled in the eye following the paracentesis. ?  ?COMPLICATIONS:  None. ?  ?TECHNIQUE:   Stop and chop ?  ?DESCRIPTION OF PROCEDURE:  The patient was examined and consented in the preoperative holding area where the aforementioned topical anesthesia was applied to the right eye and then brought back to the Operating Room where the right eye was prepped and draped in the usual sterile ophthalmic fashion and a lid speculum was placed. A paracentesis was created with the side port blade and the anterior chamber was filled with viscoelastic. A near clear corneal incision was performed with the steel keratome. A continuous curvilinear capsulorrhexis was performed with a cystotome followed by the capsulorrhexis forceps. Hydrodissection and hydrodelineation were carried out with BSS on a blunt cannula. The lens was removed in a stop and chop  technique and the remaining cortical material was removed with the irrigation-aspiration handpiece. The capsular bag was inflated with viscoelastic and the Technis [id number]  lens was placed in the capsular bag without complication. The remaining viscoelastic was removed from the eye with the irrigation-aspiration handpiece. The wounds were hydrated. The anterior chamber was flushed with BSS and the eye was inflated to physiologic pressure. 0.180mof Vigamox was placed in the anterior chamber. The wounds were found to be water tight. The eye was dressed with Combigan. The patient was given protective glasses to wear throughout the day and a shield with which to sleep tonight. The patient was also given drops with which to begin a drop regimen today and  will follow-up with me in one day. ?Implant Name Type Inv. Item Serial No. Manufacturer Lot No. LRB No. Used Action  ?LENS IOL TECNIS EYHANCE 21.5 - [id number] Lens LENS IOL TECNIS EYHANCE 21.5 [id number]  Right 1 Implanted  ? ?Procedure(s) with comments: ?CATARACT EXTRACTION PHACO AND INTRAOCULAR LENS PLACEMENT (IOC) RIGHT DIABETIC 6.47 00:53.0 (Right) - Diabetic ? ?Electronically signed: WiBirder Robson/08/2022 9:19 AM ? ?

## 2022-01-13 NOTE — H&P (Signed)
[place]  ? ?Primary Care Physician:  Tracie Harrier, MD ?Ophthalmologist: Dr. George Ina ? ?Pre-Procedure History & Physical: ?HPI:  Jasmine Robinson is a 71 y.o. female here for cataract surgery. ?  ?Past Medical History:  ?Diagnosis Date  ? Anemia   ? Depression   ? Diabetes mellitus   ? Fatty liver   ? GERD (gastroesophageal reflux disease)   ? Headache   ? Sleep apnea   ? Resolved with weight loss  ? ? ?Past Surgical History:  ?Procedure Laterality Date  ? ABDOMINAL HYSTERECTOMY    ? BREAST BIOPSY Right 07/03/13  ? stereo biopsy/ clip- tophat-neg  ? ESOPHAGOGASTRODUODENOSCOPY (EGD) WITH PROPOFOL N/A 04/25/2021  ? Procedure: ESOPHAGOGASTRODUODENOSCOPY (EGD) WITH PROPOFOL;  Surgeon: Lesly Rubenstein, MD;  Location: [facility] ENDOSCOPY;  Service: Endoscopy;  Laterality: N/A;  ? KNEE SURGERY  2007  ? torn meniscus  ? ? ?Prior to Admission medications   ?Medication Sig Start Date End Date Taking? Authorizing Provider  ?atorvastatin (LIPITOR) 10 MG tablet Take 10 mg by mouth daily.   Yes [provider]  ?citalopram (CELEXA) 20 MG tablet Take 40 mg by mouth daily. 11/27/16 01/13/22 Yes [provider]  ?desvenlafaxine (PRISTIQ) 50 MG 24 hr tablet 25 mg.   Yes [provider]  ?Ferrous Sulfate (IRON) 325 (65 FE) MG TABS Take 1 tablet by mouth daily.   Yes [provider]  ?JARDIANCE 25 MG TABS tablet  01/13/18  Yes [provider]  ?lisinopril (ZESTRIL) 5 MG tablet Take 5 mg by mouth daily. 07/20/21  Yes [provider]  ?metFORMIN (GLUCOPHAGE) 1000 MG tablet Take 1 tablet by mouth 2 (two) times daily. 01/04/99  Yes [provider]  ?Jonetta Speak LANCETS 67R [place]  12/28/17  Yes [provider]  ?Semaglutide,0.25 or 0.5MG/DOS, 2 MG/1.5ML SOPN Inject 0.5 mg weekly 01/07/21  Yes [provider]  ?valACYclovir (VALTREX) 1000 MG tablet Take 500 mg by mouth daily. 01/08/17  Yes [provider]  ?vitamin B-12 (CYANOCOBALAMIN) 500  MCG tablet Take 500 mcg by mouth daily.   Yes [provider]  ?Blood Glucose Monitoring Suppl (FIFTY50 GLUCOSE METER 2.0) w/Device KIT Use as directed. ONE TOUCH Dx E11.9 04/06/17   [provider]  ?glucose blood test strip USE ONE STRIP TO CHECK BLOOD GLUCOSE LEVELS TWICE DAILY AS DIRECTED 01/09/15   [provider]  ?Lancet Devices ([employer] LANCING DEVICE) MISC Use 1 each as directed. Use as instructed.    [provider]  ?pantoprazole (PROTONIX) 40 MG tablet Take 1 tablet (40 mg total) by mouth daily. ?Patient not taking: Reported on 10/01/2021 03/02/21 03/02/22  Harvest Dark, MD  ? ? ?Allergies as of 12/19/2021  ? (No Known Allergies)  ? ? ?Family History  ?Problem Relation Age of Onset  ? Diabetes Father   ? Breast cancer Cousin   ? ? ?Social History  ? ?Socioeconomic History  ? Marital status: Married  ?  Spouse name: Not on file  ? Number of children: Not on file  ? Years of education: Not on file  ? Highest education level: Not on file  ?Occupational History  ? Not on file  ?Tobacco Use  ? Smoking status: Never  ? Smokeless tobacco: Never  ?Vaping Use  ? Vaping Use: Never used  ?Substance and Sexual Activity  ? Alcohol use: Yes  ?  Comment: occasional   ? Drug use: No  ? Sexual activity: Not on file  ?Other Topics Concern  ?  Not on file  ?Social History Narrative  ? Not on file  ? ?Social Determinants of Health  ? ?Financial Resource Strain: Not on file  ?Food Insecurity: Not on file  ?Transportation Needs: Not on file  ?Physical Activity: Not on file  ?Stress: Not on file  ?Social Connections: Not on file  ?Intimate Partner Violence: Not on file  ? ? ?Review of Systems: ?See HPI, otherwise negative ROS ? ?Physical Exam: ?BP 123/73   Pulse 79   Temp 97.9 ?F (36.6 ?C) (Temporal)   Ht _0  (1.702 m)   Wt 83.4 kg   SpO2 99%   BMI 28.80 kg/m?  ?General:   Alert, cooperative in NAD ?Head:  Normocephalic and atraumatic. ?Respiratory:  Normal work of  breathing. ?Cardiovascular:  RRR ? ?Impression/Plan: ?Jasmine Robinson is here for cataract surgery. ? ?Risks, benefits, limitations, and alternatives regarding cataract surgery have been reviewed with the patient.  Questions have been answered.  All parties agreeable. ? ? ?Birder Robson, MD  01/13/2022, 8:46 AM ? ? ?

## 2022-01-13 NOTE — Anesthesia Postprocedure Evaluation (Signed)
Anesthesia Post Note ? ?Patient: Jasmine Robinson ? ?Procedure(s) Performed: CATARACT EXTRACTION PHACO AND INTRAOCULAR LENS PLACEMENT (IOC) RIGHT DIABETIC 6.47 00:53.0 (Right: Eye) ? ? ?  ?Patient location during evaluation: PACU ?Anesthesia Type: MAC ?Level of consciousness: awake and alert and oriented ?Pain management: satisfactory to patient ?Vital Signs Assessment: post-procedure vital signs reviewed and stable ?Respiratory status: spontaneous breathing, nonlabored ventilation and respiratory function stable ?Cardiovascular status: blood pressure returned to baseline and stable ?Postop Assessment: Adequate PO intake and No signs of nausea or vomiting ?Anesthetic complications: no ? ? ?No notable events documented. ? ?Raliegh Ip ? ? ? ? ? ?

## 2022-01-13 NOTE — Anesthesia Preprocedure Evaluation (Signed)
Anesthesia Evaluation  ?Patient identified by MRN, date of birth, ID band ?Patient awake ? ? ? ?Reviewed: ?Allergy & Precautions, H&P , NPO status , Patient's Chart, lab work & pertinent test results ? ?Airway ?Mallampati: II ? ?TM Distance: >3 FB ?Neck ROM: full ? ? ? Dental ?no notable dental hx. ? ?  ?Pulmonary ?sleep apnea ,  ?  ?Pulmonary exam normal ?breath sounds clear to auscultation ? ? ? ? ? ? Cardiovascular ?Normal cardiovascular exam ?Rhythm:regular Rate:Normal ? ? ?  ?Neuro/Psych ?  ? GI/Hepatic ?GERD  ,  ?Endo/Other  ?diabetes ? Renal/GU ?  ? ?  ?Musculoskeletal ? ? Abdominal ?  ?Peds ? Hematology ?  ?Anesthesia Other Findings ? ? Reproductive/Obstetrics ? ?  ? ? ? ? ? ? ? ? ? ? ? ? ? ?  ?  ? ? ? ? ? ? ? ? ?Anesthesia Physical ?Anesthesia Plan ? ?ASA: 2 ? ?Anesthesia Plan: MAC  ? ?Post-op Pain Management: Minimal or no pain anticipated  ? ?Induction:  ? ?PONV Risk Score and Plan: 2 and Treatment may vary due to age or medical condition and TIVA ? ?Airway Management Planned:  ? ?Additional Equipment:  ? ?Intra-op Plan:  ? ?Post-operative Plan:  ? ?Informed Consent: I have reviewed the patients History and Physical, chart, labs and discussed the procedure including the risks, benefits and alternatives for the proposed anesthesia with the patient or authorized representative who has indicated his/her understanding and acceptance.  ? ? ? ?Dental Advisory Given ? ?Plan Discussed with: CRNA ? ?Anesthesia Plan Comments:   ? ? ? ? ? ? ?Anesthesia Quick Evaluation ? ?

## 2022-01-13 NOTE — Transfer of Care (Signed)
Immediate Anesthesia Transfer of Care Note ? ?Patient: Jasmine Robinson ? ?Procedure(s) Performed: CATARACT EXTRACTION PHACO AND INTRAOCULAR LENS PLACEMENT (IOC) RIGHT DIABETIC 6.47 00:53.0 (Right: Eye) ? ?Patient Location: PACU ? ?Anesthesia Type: MAC ? ?Level of Consciousness: awake, alert  and patient cooperative ? ?Airway and Oxygen Therapy: Patient Spontanous Breathing and Patient connected to supplemental oxygen ? ?Post-op Assessment: Post-op Vital signs reviewed, Patient's Cardiovascular Status Stable, Respiratory Function Stable, Patent Airway and No signs of Nausea or vomiting ? ?Post-op Vital Signs: Reviewed and stable ? ?Complications: No notable events documented. ? ?

## 2022-01-14 ENCOUNTER — Encounter: Payer: Self-pay | Admitting: Ophthalmology

## 2022-01-22 NOTE — Discharge Instructions (Signed)

## 2022-01-27 ENCOUNTER — Other Ambulatory Visit: Payer: Self-pay

## 2022-01-27 ENCOUNTER — Encounter
Admission: RE | Disposition: A | Discharge status: Home / Self Care | Payer: Self-pay | Source: Home / Self Care | Attending: Ophthalmology

## 2022-01-27 ENCOUNTER — Encounter: Payer: Self-pay | Admitting: Ophthalmology

## 2022-01-27 ENCOUNTER — Ambulatory Visit: Payer: Medicare Other | Admitting: Anesthesiology

## 2022-01-27 ENCOUNTER — Ambulatory Visit
Admission: RE | Admit: 2022-01-27 | Discharge: 2022-01-27 | Disposition: A | Discharge status: Home / Self Care | Payer: Medicare Other | Attending: Ophthalmology | Admitting: Ophthalmology

## 2022-01-27 DIAGNOSIS — M1990 Unspecified osteoarthritis, unspecified site: Secondary | ICD-10-CM | POA: Insufficient documentation

## 2022-01-27 DIAGNOSIS — H2512 Age-related nuclear cataract, left eye: Secondary | ICD-10-CM | POA: Insufficient documentation

## 2022-01-27 DIAGNOSIS — F32A Depression, unspecified: Secondary | ICD-10-CM | POA: Diagnosis not present

## 2022-01-27 DIAGNOSIS — E1136 Type 2 diabetes mellitus with diabetic cataract: Secondary | ICD-10-CM | POA: Insufficient documentation

## 2022-01-27 DIAGNOSIS — K219 Gastro-esophageal reflux disease without esophagitis: Secondary | ICD-10-CM | POA: Diagnosis not present

## 2022-01-27 DIAGNOSIS — G4730 Sleep apnea, unspecified: Secondary | ICD-10-CM | POA: Insufficient documentation

## 2022-01-27 HISTORY — PX: CATARACT EXTRACTION W/PHACO: SHX586

## 2022-01-27 LAB — GLUCOSE, CAPILLARY
Glucose-Capillary: 112 mg/dL — ABNORMAL HIGH (ref 70–99)
Glucose-Capillary: 112 mg/dL — ABNORMAL HIGH (ref 70–99)

## 2022-01-27 SURGERY — PHACOEMULSIFICATION, CATARACT, WITH IOL INSERTION
Anesthesia: Monitor Anesthesia Care | Site: Eye | Laterality: Left

## 2022-01-27 MED ORDER — TETRACAINE HCL 0.5 % OP SOLN
1.0000 [drp] | OPHTHALMIC | Status: DC | PRN
  Administered 2022-01-27 (×3): 1 [drp] via OPHTHALMIC

## 2022-01-27 MED ORDER — SIGHTPATH DOSE#1 NA CHONDROIT SULF-NA HYALURON 40-17 MG/ML IO SOLN
INTRAOCULAR | Status: DC | PRN
  Administered 2022-01-27: 1 mL via INTRAOCULAR

## 2022-01-27 MED ORDER — SIGHTPATH DOSE#1 BSS IO SOLN
INTRAOCULAR | Status: DC | PRN
  Administered 2022-01-27: 74 mL via OPHTHALMIC

## 2022-01-27 MED ORDER — MOXIFLOXACIN HCL 0.5 % OP SOLN
OPHTHALMIC | Status: DC | PRN
  Administered 2022-01-27: 0.2 mL via OPHTHALMIC

## 2022-01-27 MED ORDER — BRIMONIDINE TARTRATE-TIMOLOL 0.2-0.5 % OP SOLN
OPHTHALMIC | Status: DC | PRN
  Administered 2022-01-27: 1 [drp] via OPHTHALMIC

## 2022-01-27 MED ORDER — MIDAZOLAM HCL 2 MG/2ML IJ SOLN
INTRAMUSCULAR | Status: DC | PRN
  Administered 2022-01-27: 1 mg via INTRAVENOUS

## 2022-01-27 MED ORDER — FENTANYL CITRATE (PF) 100 MCG/2ML IJ SOLN
INTRAMUSCULAR | Status: DC | PRN
  Administered 2022-01-27: 50 ug via INTRAVENOUS

## 2022-01-27 MED ORDER — SIGHTPATH DOSE#1 BSS IO SOLN
INTRAOCULAR | Status: DC | PRN
  Administered 2022-01-27: 15 mL

## 2022-01-27 MED ORDER — LACTATED RINGERS IV SOLN: INTRAVENOUS | Status: DC

## 2022-01-27 MED ORDER — ARMC OPHTHALMIC DILATING DROPS
1.0000 "application " | OPHTHALMIC | Status: DC | PRN
  Administered 2022-01-27 (×3): 1 via OPHTHALMIC

## 2022-01-27 MED ORDER — SIGHTPATH DOSE#1 BSS IO SOLN
INTRAOCULAR | Status: DC | PRN
  Administered 2022-01-27: 1 mL via INTRAMUSCULAR

## 2022-01-27 SURGICAL SUPPLY — 10 items
CATARACT SUITE SIGHTPATH (MISCELLANEOUS) ×2 IMPLANT
FEE CATARACT SUITE SIGHTPATH (MISCELLANEOUS) ×2 IMPLANT
GLOVE SURG ENC TEXT LTX SZ8 (GLOVE) ×3 IMPLANT
GLOVE SURG TRIUMPH 8.0 PF LTX (GLOVE) ×3 IMPLANT
LENS IOL TECNIS EYHANCE 21.5 (Intraocular Lens) ×1 IMPLANT
NDL FILTER BLUNT 18X1 1/2 (NEEDLE) ×2 IMPLANT
NEEDLE FILTER BLUNT 18X 1/2SAF (NEEDLE) ×1
NEEDLE FILTER BLUNT 18X1 1/2 (NEEDLE) ×1 IMPLANT
SYR 3ML LL SCALE MARK (SYRINGE) ×3 IMPLANT
WATER STERILE IRR 250ML POUR (IV SOLUTION) ×3 IMPLANT

## 2022-01-27 NOTE — Anesthesia Postprocedure Evaluation (Signed)
Anesthesia Post Note ? ?Patient: Jasmine Robinson ? ?Procedure(s) Performed: CATARACT EXTRACTION PHACO AND INTRAOCULAR LENS PLACEMENT (IOC) LEFT DIABETIC 6.36 00:57.7 (Left: Eye) ? ? ?  ?Patient location during evaluation: PACU ?Anesthesia Type: MAC ?Level of consciousness: awake and alert ?Pain management: pain level controlled ?Vital Signs Assessment: post-procedure vital signs reviewed and stable ?Respiratory status: spontaneous breathing and nonlabored ventilation ?Cardiovascular status: blood pressure returned to baseline ?Postop Assessment: no apparent nausea or vomiting ?Anesthetic complications: no ? ? ?No notable events documented. ? ?Ardeth Sportsman ? ? ? ? ? ?

## 2022-01-27 NOTE — H&P (Signed)
[facility]  ? ?Primary Care Physician:  Tracie Harrier, MD ?Ophthalmologist: Dr. George Ina ? ?Pre-Procedure History & Physical: ?HPI:  Jasmine Robinson is a 71 y.o. female here for cataract surgery. ?  ?Past Medical History:  ?Diagnosis Date  ? Anemia   ? Depression   ? Diabetes mellitus   ? Fatty liver   ? GERD (gastroesophageal reflux disease)   ? Headache   ? Sleep apnea   ? Resolved with weight loss  ? ? ?Past Surgical History:  ?Procedure Laterality Date  ? ABDOMINAL HYSTERECTOMY    ? BREAST BIOPSY Right 07/03/13  ? stereo biopsy/ clip- tophat-neg  ? CATARACT EXTRACTION W/PHACO Right 01/13/2022  ? Procedure: CATARACT EXTRACTION PHACO AND INTRAOCULAR LENS PLACEMENT (IOC) RIGHT DIABETIC 6.47 00:53.0;  Surgeon: Birder Robson, MD;  Location: [place];  Service: Ophthalmology;  Laterality: Right;  Diabetic  ? ESOPHAGOGASTRODUODENOSCOPY (EGD) WITH PROPOFOL N/A 04/25/2021  ? Procedure: ESOPHAGOGASTRODUODENOSCOPY (EGD) WITH PROPOFOL;  Surgeon: Lesly Rubenstein, MD;  Location: [facility];  Service: Endoscopy;  Laterality: N/A;  ? KNEE SURGERY  2007  ? torn meniscus  ? ? ?Prior to Admission medications   ?Medication Sig Start Date End Date Taking? Authorizing Provider  ?atorvastatin (LIPITOR) 10 MG tablet Take 10 mg by mouth daily.   Yes [provider]  ?desvenlafaxine (PRISTIQ) 50 MG 24 hr tablet 25 mg.   Yes [provider]  ?Ferrous Sulfate (IRON) 325 (65 FE) MG TABS Take 1 tablet by mouth daily.   Yes [provider]  ?JARDIANCE 25 MG TABS tablet  01/13/18  Yes [provider]  ?lisinopril (ZESTRIL) 5 MG tablet Take 5 mg by mouth daily. 07/20/21  Yes [provider]  ?metFORMIN (GLUCOPHAGE) 1000 MG tablet Take 1 tablet by mouth 2 (two) times daily. 01/04/99  Yes [provider]  ?Semaglutide,0.25 or 0.5MG/DOS, 2 MG/1.5ML SOPN Inject 0.5 mg weekly 01/07/21  Yes [provider]  ?valACYclovir (VALTREX) 1000 MG tablet Take 500  mg by mouth daily. 01/08/17  Yes [provider]  ?vitamin B-12 (CYANOCOBALAMIN) 500 MCG tablet Take 500 mcg by mouth daily.   Yes [provider]  ?Blood Glucose Monitoring Suppl (FIFTY50 GLUCOSE METER 2.0) w/Device KIT Use as directed. ONE TOUCH Dx E11.9 04/06/17   [provider]  ?citalopram (CELEXA) 20 MG tablet Take 40 mg by mouth daily. 11/27/16 01/13/22  [provider]  ?glucose blood test strip USE ONE STRIP TO CHECK BLOOD GLUCOSE LEVELS TWICE DAILY AS DIRECTED 01/09/15   [provider]  ?Lancet Devices ([employer] LANCING DEVICE) MISC Use 1 each as directed. Use as instructed.    [provider]  ?Jonetta Speak LANCETS 60A [place]  12/28/17   [provider]  ?pantoprazole (PROTONIX) 40 MG tablet Take 1 tablet (40 mg total) by mouth daily. ?Patient not taking: Reported on 10/01/2021 03/02/21 03/02/22  Harvest Dark, MD  ? ? ?Allergies as of 12/19/2021  ? (No Known Allergies)  ? ? ?Family History  ?Problem Relation Age of Onset  ? Diabetes Father   ? Breast cancer Cousin   ? ? ?Social History  ? ?Socioeconomic History  ? Marital status: Married  ?  Spouse name: Not on file  ? Number of children: Not on file  ? Years of education: Not on file  ? Highest education level: Not on file  ?Occupational History  ? Not on file  ?Tobacco Use  ? Smoking status: Never  ? Smokeless tobacco: Never  ?Vaping  Use  ? Vaping Use: Never used  ?Substance and Sexual Activity  ? Alcohol use: Yes  ?  Comment: occasional   ? Drug use: No  ? Sexual activity: Not on file  ?Other Topics Concern  ? Not on file  ?Social History Narrative  ? Not on file  ? ?Social Determinants of Health  ? ?Financial Resource Strain: Not on file  ?Food Insecurity: Not on file  ?Transportation Needs: Not on file  ?Physical Activity: Not on file  ?Stress: Not on file  ?Social Connections: Not on file  ?Intimate Partner Violence: Not on file  ? ? ?Review of Systems: ?See HPI, otherwise negative  ROS ? ?Physical Exam: ?BP 111/70   Pulse 90   Temp (!) 97.5 ?F (36.4 ?C) (Temporal)   Resp 18   Ht '5\' 7"'  (1.702 m)   Wt 84.4 kg   SpO2 97%   BMI 29.13 kg/m?  ?General:   Alert, cooperative in NAD ?Head:  Normocephalic and atraumatic. ?Respiratory:  Normal work of breathing. ?Cardiovascular:  RRR ? ?Impression/Plan: ?Jasmine Robinson is here for cataract surgery. ? ?Risks, benefits, limitations, and alternatives regarding cataract surgery have been reviewed with the patient.  Questions have been answered.  All parties agreeable. ? ? ?Birder Robson, MD  01/27/2022, 8:26 AM ? ? ?

## 2022-01-27 NOTE — Op Note (Signed)
PREOPERATIVE DIAGNOSIS:  Nuclear sclerotic cataract of the left eye. ?  ?POSTOPERATIVE DIAGNOSIS:  Nuclear sclerotic cataract of the left eye. ?  ?OPERATIVE PROCEDURE:ORPROCALL@ ?  ?SURGEON:  Birder Robson, MD. ?  ?ANESTHESIA: ? ?Anesthesiologist: Ardeth Sportsman, MD ? ?1.      Managed anesthesia care. ?2.     0.75m of Shugarcaine was instilled following the paracentesis ?  ?COMPLICATIONS:  None. ?  ?TECHNIQUE:   Stop and chop ?  ?DESCRIPTION OF PROCEDURE:  The patient was examined and consented in the preoperative holding area where the aforementioned topical anesthesia was applied to the left eye and then brought back to the Operating Room where the left eye was prepped and draped in the usual sterile ophthalmic fashion and a lid speculum was placed. A paracentesis was created with the side port blade and the anterior chamber was filled with viscoelastic. A near clear corneal incision was performed with the steel keratome. A continuous curvilinear capsulorrhexis was performed with a cystotome followed by the capsulorrhexis forceps. Hydrodissection and hydrodelineation were carried out with BSS on a blunt cannula. The lens was removed in a stop and chop  technique and the remaining cortical material was removed with the irrigation-aspiration handpiece. The capsular bag was inflated with viscoelastic and the Technis [id number] lens was placed in the capsular bag without complication. The remaining viscoelastic was removed from the eye with the irrigation-aspiration handpiece. The wounds were hydrated. The anterior chamber was flushed with BSS and the eye was inflated to physiologic pressure. 0.126mVigamox was placed in the anterior chamber. The wounds were found to be water tight. The eye was dressed with Combigan. The patient was given protective glasses to wear throughout the day and a shield with which to sleep tonight. The patient was also given drops with which to begin a drop regimen today and will follow-up with  me in one day. ?Implant Name Type Inv. Item Serial No. Manufacturer Lot No. LRB No. Used Action  ?LENS IOL TECNIS EYHANCE 21.5 - [id number] Lens LENS IOL TECNIS EYHANCE 21.5 [id number]  Left 1 Implanted  ?  ?Procedure(s) with comments: ?CATARACT EXTRACTION PHACO AND INTRAOCULAR LENS PLACEMENT (IOC) LEFT DIABETIC 6.36 00:57.7 (Left) - Diabetic ? ?Electronically signed: WiBirder Robson/25/2023 8:50 AM ? ?

## 2022-01-27 NOTE — Transfer of Care (Signed)
Immediate Anesthesia Transfer of Care Note ? ?Patient: Jasmine Robinson ? ?Procedure(s) Performed: CATARACT EXTRACTION PHACO AND INTRAOCULAR LENS PLACEMENT (IOC) LEFT DIABETIC 6.36 00:57.7 (Left: Eye) ? ?Patient Location: PACU ? ?Anesthesia Type: MAC ? ?Level of Consciousness: awake, alert  and patient cooperative ? ?Airway and Oxygen Therapy: Patient Spontanous Breathing and Patient connected to supplemental oxygen ? ?Post-op Assessment: Post-op Vital signs reviewed, Patient's Cardiovascular Status Stable, Respiratory Function Stable, Patent Airway and No signs of Nausea or vomiting ? ?Post-op Vital Signs: Reviewed and stable ? ?Complications: No notable events documented. ? ?

## 2022-01-27 NOTE — Anesthesia Preprocedure Evaluation (Signed)
Anesthesia Evaluation  ?Patient identified by MRN, date of birth, ID band ?Patient awake ? ? ? ?Reviewed: ?Allergy & Precautions, H&P , NPO status , Patient's Chart, lab work & pertinent test results ? ?Airway ?Mallampati: II ? ?TM Distance: >3 FB ?Neck ROM: full ? ? ? Dental ?no notable dental hx. ? ?  ?Pulmonary ?sleep apnea ,  ?  ?Pulmonary exam normal ? ? ? ? ? ? ? Cardiovascular ?negative cardio ROS ?Normal cardiovascular exam ? ? ?  ?Neuro/Psych ? Headaches, Depression   ? GI/Hepatic ?GERD  Controlled,  ?Endo/Other  ?diabetes ? Renal/GU ?  ? ?  ?Musculoskeletal ? ?(+) Arthritis ,  ? Abdominal ?Normal abdominal exam  (+)   ?Peds ? Hematology ?negative hematology ROS ?(+)   ?Anesthesia Other Findings ? ? Reproductive/Obstetrics ? ?  ? ? ? ? ? ? ? ? ? ? ? ? ? ?  ?  ? ? ? ? ? ? ? ? ?Anesthesia Physical ? ?Anesthesia Plan ? ?ASA: 2 ? ?Anesthesia Plan: MAC  ? ?Post-op Pain Management: Minimal or no pain anticipated  ? ?Induction: Intravenous ? ?PONV Risk Score and Plan: 2 and Treatment may vary due to age or medical condition and TIVA ? ?Airway Management Planned:  ? ?Additional Equipment: None ? ?Intra-op Plan:  ? ?Post-operative Plan:  ? ?Informed Consent: I have reviewed the patients History and Physical, chart, labs and discussed the procedure including the risks, benefits and alternatives for the proposed anesthesia with the patient or authorized representative who has indicated his/her understanding and acceptance.  ? ? ? ?Dental Advisory Given ? ?Plan Discussed with: CRNA ? ?Anesthesia Plan Comments:   ? ? ? ? ? ? ?Anesthesia Quick Evaluation ? ?

## 2022-04-27 ENCOUNTER — Telehealth (INDEPENDENT_AMBULATORY_CARE_PROVIDER_SITE_OTHER): Payer: Self-pay | Admitting: Vascular Surgery

## 2022-04-27 NOTE — Telephone Encounter (Signed)
LVM for pt to call Radiology and schedule her 6 month CT per Dr. Delana Meyer. She will need to make a   CT results. See GS.

## 2022-04-27 NOTE — Telephone Encounter (Signed)
LVM for pt TCB after I returned her call. Patient needs to call Radiology scheduling and schedule her CT appt. After scheduling CT, please advise pt TCB to our office and make CT results appt with Dr. Delana Meyer.

## 2022-05-05 ENCOUNTER — Ambulatory Visit
Admission: RE | Admit: 2022-05-05 | Discharge: 2022-05-05 | Disposition: A | Discharge status: Home / Self Care | Payer: Medicare Other | Source: Ambulatory Visit | Attending: Nurse Practitioner | Admitting: Nurse Practitioner

## 2022-05-05 DIAGNOSIS — I728 Aneurysm of other specified arteries: Secondary | ICD-10-CM | POA: Diagnosis not present

## 2022-05-05 LAB — POCT I-STAT CREATININE: Creatinine, Ser: 0.9 mg/dL (ref 0.44–1.00)

## 2022-05-05 MED ORDER — IOHEXOL 350 MG/ML SOLN
100.0000 mL | Freq: Once | INTRAVENOUS | Status: AC | PRN
  Administered 2022-05-05: 100 mL via INTRAVENOUS

## 2022-05-24 NOTE — Progress Notes (Signed)
MRN : [id number]  Jasmine Robinson is a 71 y.o. (1951/03/12) female who presents with chief complaint of check circulation.  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 The aneurysm was found incidentally by CT scan. Patient denies abdominal pain, flank pain or unusual back pain, no other abdominal complaints.     No family history of aneurysm.    Patient denies amaurosis fugax or TIA symptoms. There is no history of claudication or rest pain symptoms of the lower extremities.  The patient denies angina or shortness of breath.   CT scan dated 05/05/2022 shows a splenic artery aneurysm that measures 2.2 cm   No outpatient medications have been marked as taking for the 05/25/22 encounter (Appointment) with Delana Meyer, Dolores Lory, MD.    Past Medical History:  Diagnosis Date   Anemia    Depression    Diabetes mellitus    Fatty liver    GERD (gastroesophageal reflux disease)    Headache    Sleep apnea    Resolved with weight loss    Past Surgical History:  Procedure Laterality Date   ABDOMINAL HYSTERECTOMY     BREAST BIOPSY Right 07/03/13   stereo biopsy/ clip- tophat-neg   CATARACT EXTRACTION W/PHACO Right 01/13/2022   Procedure: CATARACT EXTRACTION PHACO AND INTRAOCULAR LENS PLACEMENT (IOC) RIGHT DIABETIC 6.47 00:53.0;  Surgeon: Birder Robson, MD;  Location: [place];  Service: Ophthalmology;  Laterality: Right;  Diabetic   CATARACT EXTRACTION W/PHACO Left 01/27/2022   Procedure: CATARACT EXTRACTION PHACO AND INTRAOCULAR LENS PLACEMENT (IOC) LEFT DIABETIC 6.36 00:57.7;  Surgeon: Birder Robson, MD;  Location: [place];  Service: Ophthalmology;  Laterality: Left;  Diabetic   ESOPHAGOGASTRODUODENOSCOPY (EGD) WITH PROPOFOL N/A 04/25/2021   Procedure: ESOPHAGOGASTRODUODENOSCOPY (EGD) WITH PROPOFOL;  Surgeon: Lesly Rubenstein, MD;  Location: [facility];  Service:  Endoscopy;  Laterality: N/A;   KNEE SURGERY  2007   torn meniscus    Social History Social History   Tobacco Use   Smoking status: Never   Smokeless tobacco: Never  Vaping Use   Vaping Use: Never used  Substance Use Topics   Alcohol use: Yes    Comment: occasional    Drug use: No    Family History Family History  Problem Relation Age of Onset   Diabetes Father    Breast cancer Cousin     No Known Allergies   REVIEW OF SYSTEMS (Negative unless checked)  Constitutional: '[]'$ Weight loss  '[]'$ Fever  '[]'$ Chills Cardiac: '[]'$ Chest pain   '[]'$ Chest pressure   '[]'$ Palpitations   '[]'$ Shortness of breath when laying flat   '[]'$ Shortness of breath with exertion. Vascular:  '[x]'$ Pain in legs with walking   '[]'$ Pain in legs at rest  '[]'$ History of DVT   '[]'$ Phlebitis   '[]'$ Swelling in legs   '[]'$ Varicose veins   '[]'$ Non-healing ulcers Pulmonary:   '[]'$ Uses home oxygen   '[]'$ Productive cough   '[]'$ Hemoptysis   '[]'$ Wheeze  '[]'$ COPD   '[]'$ Asthma Neurologic:  '[]'$ Dizziness   '[]'$ Seizures   '[]'$ History of stroke   '[]'$ History of TIA  '[]'$ Aphasia   '[]'$ Vissual changes   '[]'$ Weakness or numbness in arm   '[]'$ Weakness or numbness in leg Musculoskeletal:   '[]'$ Joint swelling   '[]'$ Joint pain   '[]'$ Low back pain Hematologic:  '[]'$ Easy bruising  '[]'$ Easy bleeding   '[]'$ Hypercoagulable state   '[]'$ Anemic Gastrointestinal:  '[]'$ Diarrhea   '[]'$ Vomiting  [  x]Gastroesophageal reflux/heartburn   '[]'$ Difficulty swallowing. Genitourinary:  '[]'$ Chronic kidney disease   '[]'$ Difficult urination  '[]'$ Frequent urination   '[]'$ Blood in urine Skin:  '[]'$ Rashes   '[]'$ Ulcers  Psychological:  '[]'$ History of anxiety   '[]'$  History of major depression.  Physical Examination  There were no vitals filed for this visit. There is no height or weight on file to calculate BMI. Gen: WD/WN, NAD Head: [place]/AT, No temporalis wasting.  Ear/Nose/Throat: Hearing grossly intact, nares w/o erythema or drainage Eyes: PER, EOMI, sclera nonicteric.  Neck: Supple, no masses.  No bruit or JVD.  Pulmonary:  Good air  movement, no audible wheezing, no use of accessory muscles.  Cardiac: RRR, normal S1, S2, no Murmurs. Vascular:  mild trophic changes, no open wounds Vessel Right Left  Radial Palpable Palpable  Gastrointestinal: soft, non-distended. No guarding/no peritoneal signs.  Musculoskeletal: M/S 5/5 throughout.  No visible deformity.  Neurologic: CN 2-12 intact. Pain and light touch intact in extremities.  Symmetrical.  Speech is fluent. Motor exam as listed above. Psychiatric: Judgment intact, Mood & affect appropriate for pt's clinical situation. Dermatologic: No rashes or ulcers noted.  No changes consistent with cellulitis.   CBC Lab Results  Component Value Date   WBC 12.3 (H) 03/02/2021   HGB 12.5 03/02/2021   HCT 39.1 03/02/2021   MCV 93.8 03/02/2021   PLT 226 03/02/2021    BMET    Component Value Date/Time   NA 136 03/02/2021 1644   NA 139 10/26/2013 1514   K 5.1 03/02/2021 1644   K 4.4 10/26/2013 1514   CL 102 03/02/2021 1644   CL 107 10/26/2013 1514   CO2 24 03/02/2021 1644   CO2 29 10/26/2013 1514   GLUCOSE 106 (H) 03/02/2021 1644   GLUCOSE 134 (H) 10/26/2013 1514   BUN 22 03/02/2021 1644   BUN 18 10/26/2013 1514   CREATININE 0.90 05/05/2022 0746   CREATININE 0.82 10/26/2013 1514   CALCIUM 9.4 03/02/2021 1644   CALCIUM 9.9 10/26/2013 1514   GFRNONAA >60 03/02/2021 1644   GFRNONAA >60 10/26/2013 1514   GFRAA >60 10/21/2015 1840   GFRAA >60 10/26/2013 1514   CrCl cannot be calculated (Unknown ideal weight.).  COAG No results found for: "INR", "PROTIME"  Radiology CT Angio Abd/Pel w/ and/or w/o  Result Date: 05/05/2022 CLINICAL DATA:  71 year old female with a history of visceral aneurysm EXAM: CTA ABDOMEN AND PELVIS WITHOUT AND WITH CONTRAST TECHNIQUE: Multidetector CT imaging of the abdomen and pelvis was performed using the standard protocol during bolus administration of intravenous contrast. Multiplanar reconstructed images and MIPs were obtained and reviewed  to evaluate the vascular anatomy. RADIATION DOSE REDUCTION: This exam was performed according to the departmental dose-optimization program which includes automated exposure control, adjustment of the mA and/or kV according to patient size and/or use of iterative reconstruction technique. CONTRAST:  121m OMNIPAQUE IOHEXOL 350 MG/ML SOLN COMPARISON:  Chest CT 03/02/2021 FINDINGS: VASCULAR Aorta: Unremarkable course, caliber, contour of the abdominal aorta. No dissection, aneurysm, or periaortic fluid. No significant atherosclerotic changes. Celiac: Celiac artery patent without significant atherosclerosis. Celiac artery contributes 2 common hepatic artery, splenic artery, left gastric artery, and a variant right hepatic artery originating from the proximal celiac axis. Splenic artery aneurysm, saccular configuration from the main splenic artery. Nearly circumferential calcifications. Maximum dimension on the axial images estimated 22 mm. There are 2 efferent splenic arteries identified. SMA: SMA patent without significant atherosclerosis. Branches are patent. Renals: - Right: Right renal artery patent. - Left: Left renal artery  patent. IMA: Inferior mesenteric artery is patent. Right lower extremity: Unremarkable course, caliber, and contour of the right iliac system. No aneurysm, dissection, or occlusion. Hypogastric artery is patent. Common femoral artery patent. Proximal SFA and profunda femoris patent. Left lower extremity: Unremarkable course, caliber, and contour of the left iliac system. No aneurysm, dissection, or occlusion. Hypogastric artery is patent. Common femoral artery patent. Proximal SFA and profunda femoris patent. Veins: Unremarkable appearance of the venous system. Review of the MIP images confirms the above findings. NON-VASCULAR Lower chest: No acute. Hepatobiliary: Cystic lesion of segment 8, 18 mm most likely a benign biliary cyst. Unremarkable gall bladder. Pancreas: Unremarkable. Spleen:  Unremarkable. Adrenals/Urinary Tract: - Right adrenal gland: Redemonstration of enhancing nodule within the right adrenal gland. Estimated greatest diameter of 2.6 cm. Indeterminate enhancement characteristics. - Left adrenal gland: Redemonstration of enhancing nodule in the left adrenal gland estimated 13 mm with indeterminate enhancement characteristics. - Right kidney: No hydronephrosis, nephrolithiasis, inflammation, or ureteral dilation. No focal lesion. - Left Kidney: No hydronephrosis, nephrolithiasis, inflammation, or ureteral dilation. No focal lesion. - Urinary Bladder: Urinary bladder decompressed Stomach/Bowel: - Stomach: Hiatal hernia.  Unremarkable stomach. - Small bowel: Small bowel decompressed. No transition point. No focal inflammatory changes. Partially fecalized distal small bowel indicating slow transit. - Appendix: Normal. - Colon: Diverticular change of the left colon. No focal inflammatory changes. No evidence of obstruction. Lymphatic: No adenopathy. Mesenteric: No free fluid or air. No mesenteric adenopathy. Reproductive: Hysterectomy Other: No hernia. Musculoskeletal: No evidence of acute fracture. Degenerative changes with no bony canal narrowing. No significant degenerative changes of the hips. IMPRESSION: Splenic artery aneurysm, saccular configuration, estimated 2.2 cm. Bilateral adrenal nodules, indeterminate on the current CT. The largest is on the right, approximately 2.6 cm. Further evaluation with either adrenal protocol MR or adrenal protocol CT is recommended. Additional ancillary findings as above. Signed, Dulcy Fanny. Nadene Rubins, RPVI [facility] Electronically Signed   By: Corrie Mckusick D.O.   On: 05/05/2022 10:42     Assessment/Plan 1. Splenic artery aneurysm (HCC) No surgery or intervention at this time. The patient has an asymptomatic splenic artery aneurysm that is less than 4 cm in maximal diameter.  I  have discussed the natural history of splenic artery aneurysm and the small risk of rupture for aneurysm less than 2.5 cm in size.  However, as these small aneurysms tend to enlarge over time, continued surveillance with ultrasound or CT scan is mandatory.  I have also discussed optimizing medical management with hypertension and lipid control and the importance of abstinence from tobacco.  The patient is also encouraged to exercise a minimum of 30 minutes 4 times a week.  Should the patient develop new onset abdominal, flank or back pain or signs of peripheral embolization they are instructed to seek medical attention immediately and to alert the physician providing care that they have an splenic artery aneurysm.  The patient voices their understanding. The patient will return in 12 months with an mesenteric duplex.  - VAS [place] MESENTERIC; Future  2. Gastroesophageal reflux disease, unspecified whether esophagitis present Continue PPI as already ordered, this medication has been reviewed and there are no changes at this time.  Avoidence of caffeine and alcohol  Moderate elevation of the head of the bed    3. Uncontrolled type 2 diabetes mellitus with hyperglycemia, without long-term current use of insulin (HCC) Continue hypoglycemic medications as already ordered, these medications have been reviewed and there are  no changes at this time.  Hgb A1C to be monitored as already arranged by primary service   4. Mixed hyperlipidemia Continue statin as ordered and reviewed, no changes at this time     Hortencia Pilar, MD  05/24/2022 4:45 PM

## 2022-05-25 ENCOUNTER — Encounter (INDEPENDENT_AMBULATORY_CARE_PROVIDER_SITE_OTHER): Payer: Self-pay | Admitting: Vascular Surgery

## 2022-05-25 ENCOUNTER — Ambulatory Visit (INDEPENDENT_AMBULATORY_CARE_PROVIDER_SITE_OTHER): Payer: Medicare Other | Admitting: Vascular Surgery

## 2022-05-25 VITALS — BP 126/74 | HR 116 | Resp 18 | Ht 67.0 in | Wt 183.0 lb

## 2022-05-25 DIAGNOSIS — K219 Gastro-esophageal reflux disease without esophagitis: Secondary | ICD-10-CM | POA: Diagnosis not present

## 2022-05-25 DIAGNOSIS — E1165 Type 2 diabetes mellitus with hyperglycemia: Secondary | ICD-10-CM | POA: Diagnosis not present

## 2022-05-25 DIAGNOSIS — E782 Mixed hyperlipidemia: Secondary | ICD-10-CM | POA: Diagnosis not present

## 2022-05-25 DIAGNOSIS — I728 Aneurysm of other specified arteries: Secondary | ICD-10-CM | POA: Diagnosis not present

## 2022-05-31 ENCOUNTER — Encounter (INDEPENDENT_AMBULATORY_CARE_PROVIDER_SITE_OTHER): Payer: Self-pay | Admitting: Vascular Surgery

## 2022-06-09 ENCOUNTER — Other Ambulatory Visit: Payer: Self-pay | Admitting: Internal Medicine

## 2022-06-09 DIAGNOSIS — Z1231 Encounter for screening mammogram for malignant neoplasm of breast: Secondary | ICD-10-CM

## 2022-07-06 ENCOUNTER — Telehealth: Payer: Self-pay | Admitting: Gastroenterology

## 2022-07-06 NOTE — Telephone Encounter (Signed)
Hi Dr. Silverio Decamp,  Patient called requesting a transfer of care and specifically asked for you to continue her GI care. She does have GI history in Epic from [facility] Records are available for you to review and advise on scheduling.   Thank you

## 2022-07-08 NOTE — Telephone Encounter (Signed)
Please schedule next available appointment with APP. Thanks

## 2022-07-08 NOTE — Telephone Encounter (Signed)
Called patient to schedule left voicemail. 

## 2022-07-23 ENCOUNTER — Ambulatory Visit
Admission: RE | Admit: 2022-07-23 | Discharge: 2022-07-23 | Disposition: A | Discharge status: Home / Self Care | Payer: Medicare Other | Source: Ambulatory Visit | Attending: Internal Medicine | Admitting: Internal Medicine

## 2022-07-23 DIAGNOSIS — Z1231 Encounter for screening mammogram for malignant neoplasm of breast: Secondary | ICD-10-CM | POA: Insufficient documentation

## 2022-08-03 ENCOUNTER — Encounter (INDEPENDENT_AMBULATORY_CARE_PROVIDER_SITE_OTHER): Payer: Self-pay

## 2022-11-06 ENCOUNTER — Other Ambulatory Visit: Payer: Self-pay | Admitting: Physician Assistant

## 2022-11-06 DIAGNOSIS — R51 Headache with orthostatic component, not elsewhere classified: Secondary | ICD-10-CM

## 2022-11-13 ENCOUNTER — Ambulatory Visit
Admission: RE | Admit: 2022-11-13 | Discharge: 2022-11-13 | Disposition: A | Discharge status: Home / Self Care | Payer: Medicare Other | Source: Ambulatory Visit | Attending: Physician Assistant | Admitting: Physician Assistant

## 2022-11-13 DIAGNOSIS — R51 Headache with orthostatic component, not elsewhere classified: Secondary | ICD-10-CM | POA: Insufficient documentation

## 2022-11-13 MED ORDER — GADOBUTROL 1 MMOL/ML IV SOLN
7.5000 mL | Freq: Once | INTRAVENOUS | Status: AC | PRN
  Administered 2022-11-13: 7.5 mL via INTRAVENOUS

## 2023-03-10 NOTE — Telephone Encounter (Signed)
Hi Dr. Lavon Paganini,    We received a referral for patient for GERD, incontinence of feces, and diarrhea. You previously approved her transfer of care request 07/06/2022. She was then seen with [facility] on 07/21/2022. She states that she has never seen a Doctor with [facility], only physicians assistants. She would still like to be seen with [facility]. Her records are in [facility] for you to review and advise on scheduling.    Thank you.

## 2023-03-11 NOTE — Telephone Encounter (Signed)
Unfortunately this will only cause confusion with multiple providers.  Cannot accommodate the transfer request at this point.

## 2023-03-15 NOTE — Telephone Encounter (Signed)
Patient advised.

## 2023-03-15 NOTE — Telephone Encounter (Signed)
Called patient to advised. Left voicemail.

## 2023-04-05 ENCOUNTER — Encounter: Payer: Self-pay | Admitting: Dietician

## 2023-04-05 ENCOUNTER — Encounter: Payer: Medicare Other | Attending: Internal Medicine | Admitting: Dietician

## 2023-04-05 DIAGNOSIS — E785 Hyperlipidemia, unspecified: Secondary | ICD-10-CM | POA: Insufficient documentation

## 2023-04-05 DIAGNOSIS — E1169 Type 2 diabetes mellitus with other specified complication: Secondary | ICD-10-CM | POA: Insufficient documentation

## 2023-04-05 DIAGNOSIS — Z713 Dietary counseling and surveillance: Secondary | ICD-10-CM | POA: Insufficient documentation

## 2023-04-05 DIAGNOSIS — E119 Type 2 diabetes mellitus without complications: Secondary | ICD-10-CM

## 2023-04-05 NOTE — Patient Instructions (Addendum)
Try Lactose-Free dairy products (Milk, Yogurt, Ice Cream) and assess your tolerance and GI symptoms.  Journal your foods when you have GI symptoms. Please bring this information when you come to your follow up!  Work towards eating three meals a day, about 5-6 hours apart!  Begin to recognize carbohydrates, proteins, and non-starchy vegetables in your food choices!  Begin to build your meals using the proportions of the Balanced Plate. First, select your carb choice(s) for the meal. Make this 25% of your meal. Next, select your source of protein to pair with your carb choice(s). Make this another 25% of your meal. Finally, complete your meal with a variety of non-starchy vegetables. Make this the remaining 50% of your meal.  When having sweet tea, try to have it with meals and take small sips while you eat.

## 2023-04-05 NOTE — Progress Notes (Signed)
Diabetes Self-Management Education  Visit Type: First/Initial  Appt. Start Time: 1110  Appt. End Time: 1215  04/05/2023  [name], identified by name and date of birth, is a 72 y.o. female with a diagnosis of Diabetes: Type 2.   ASSESSMENT  There were no vitals taken for this visit. There is no height or weight on file to calculate BMI.  Pt reports previously taking metformin and Ozempic for their DM, recently switched to Jardiance related to loose bowels. Pt will be following up with doctor in October after switching medications. Pt reports frequent gas/diarrhea/GI upset still present. Pt reports going to gastroenterologist that advised them to avoid certain foods related to their incontinence, pt unable to recall the list of foods at this time. Pt reports black stool previously, labs do not indicate anemia. Pt reports checking BG twice a day, fasting and before bed. Fasting values slightly increased since changing medications, previously 90 - 110, now 120 - 130 usually. Pt reports eating late meals will cause elevations in FBG, states they will get hungry at 9:00 PM and want to snack if they eat a small dinner. Pt reports forgetting to eat meals at times, and doesn't usually eat on a consistent schedule. Pt reports walking around the mall for activity.   Diabetes Self-Management Education - 04/05/23 1112       Visit Information   Visit Type First/Initial      Initial Visit   Diabetes Type Type 2    Date Diagnosed 30+ years ago    Are you currently following a meal plan? No    Are you taking your medications as prescribed? Yes      Health Coping   How would you rate your overall health? Good      Psychosocial Assessment   Patient Belief/Attitude about Diabetes Other (comment)   Some good, some bad   What is the hardest part about your diabetes right now, causing you the most concern, or is the most worrisome to you about your diabetes?   Making healty food and beverage  choices    Self-care barriers None    Self-management support Doctor's office    Other persons present Patient    Patient Concerns Nutrition/Meal planning;Healthy Lifestyle;Problem Solving    Special Needs None    Preferred Learning Style Other (comment)    Learning Readiness Not Ready    How often do you need to have someone help you when you read instructions, pamphlets, or other written materials from your doctor or pharmacy? 2 - Rarely    What is the last grade level you completed in school? Associate's degree      Pre-Education Assessment   Patient understands the diabetes disease and treatment process. Needs Review    Patient understands incorporating nutritional management into lifestyle. Needs Review    Patient undertands incorporating physical activity into lifestyle. Needs Review    Patient understands using medications safely. Needs Review    Patient understands monitoring blood glucose, interpreting and using results Needs Review    Patient understands prevention, detection, and treatment of acute complications. Needs Review    Patient understands prevention, detection, and treatment of chronic complications. Needs Review    Patient understands how to develop strategies to address psychosocial issues. Needs Review    Patient understands how to develop strategies to promote health/change behavior. Needs Review      Complications   Last HgB A1C per patient/outside source 6.5 %   02/18/2023   How often  do you check your blood sugar? 1-2 times/day    Fasting Blood glucose range (mg/dL) 16-109    Have you had a dilated eye exam in the past 12 months? Yes    Have you had a dental exam in the past 12 months? Yes    Are you checking your feet? No      Dietary Intake   Breakfast Oat granola bar    Lunch 1/2 Betty pimento cheesebutger, lettuce, tomato, pickles, onions, french fries w/ ranch,  1/2 chicken and waffles, sweet tea, few bites of key lime pie    Snack (afternoon) none     Dinner Baked pulled chicken in white sauce, potato salad    Beverage(s) sweet tea, water      Activity / Exercise   Activity / Exercise Type ADL's;Light (walking / raking leaves)    How many days per week do you exercise? 3    How many minutes per day do you exercise? 30    Total minutes per week of exercise 90      Patient Education   Previous Diabetes Education Yes (please comment)    Disease Pathophysiology Factors that contribute to the development of diabetes;Explored patient's options for treatment of their diabetes    Healthy Eating Role of diet in the treatment of diabetes and the relationship between the three main macronutrients and blood glucose level;Plate Method    Being Active Identified with patient nutritional and/or medication changes necessary with exercise.    Medications Reviewed patients medication for diabetes, action, purpose, timing of dose and side effects.    Monitoring Identified appropriate SMBG and/or A1C goals.;Purpose and frequency of SMBG.    Chronic complications Relationship between chronic complications and blood glucose control    Diabetes Stress and Support Role of stress on diabetes;Identified and addressed patients feelings and concerns about diabetes;Worked with patient to identify barriers to care and solutions    Lifestyle and Health Coping Lifestyle issues that need to be addressed for better diabetes care      Individualized Goals (developed by patient)   Nutrition Follow meal plan discussed    Physical Activity Not Applicable    Medications take my medication as prescribed    Monitoring  Test my blood glucose as discussed      Post-Education Assessment   Patient understands the diabetes disease and treatment process. Comprehends key points    Patient understands incorporating nutritional management into lifestyle. Comprehends key points    Patient undertands incorporating physical activity into lifestyle. Comprehends key points    Patient  understands using medications safely. Demonstrates understanding / competency    Patient understands monitoring blood glucose, interpreting and using results Comprehends key points    Patient understands prevention, detection, and treatment of acute complications. Comprehends key points    Patient understands prevention, detection, and treatment of chronic complications. Comprehends key points    Patient understands how to develop strategies to address psychosocial issues. Comprehends key points    Patient understands how to develop strategies to promote health/change behavior. Comprehends key points      Outcomes   Future DMSE 4-6 wks    Program Status Not Completed             Individualized Plan for Diabetes Self-Management Training:   Learning Objective:  Patient will have a greater understanding of diabetes self-management. Patient education plan is to attend individual and/or group sessions per assessed needs and concerns.   Plan:   Patient Instructions  Try  Lactose-Free dairy products (Milk, Yogurt, Ice Cream) and assess your tolerance and GI symptoms.  Journal your foods when you have GI symptoms. Please bring this information when you come to your follow up!  Work towards eating three meals a day, about 5-6 hours apart!  Begin to recognize carbohydrates, proteins, and non-starchy vegetables in your food choices!  Begin to build your meals using the proportions of the Balanced Plate. First, select your carb choice(s) for the meal. Make this 25% of your meal. Next, select your source of protein to pair with your carb choice(s). Make this another 25% of your meal. Finally, complete your meal with a variety of non-starchy vegetables. Make this the remaining 50% of your meal.  When having sweet tea, try to have it with meals and take small sips while you eat.    Expected Outcomes:     Education material provided: My Plate, Food List, Low FODMAP Nutrition Therapy  If  problems or questions, patient to contact team via:  Phone and Email  Future DSME appointment: 4-6 wks

## 2023-05-19 ENCOUNTER — Encounter: Payer: Self-pay | Admitting: Dietician

## 2023-05-19 ENCOUNTER — Encounter: Payer: Medicare Other | Attending: Internal Medicine | Admitting: Dietician

## 2023-05-19 VITALS — Ht 67.0 in | Wt 169.3 lb

## 2023-05-19 DIAGNOSIS — E119 Type 2 diabetes mellitus without complications: Secondary | ICD-10-CM | POA: Diagnosis present

## 2023-05-19 NOTE — Progress Notes (Signed)
Diabetes Self-Management Education  Visit Type: Follow-up  Appt. Start Time: 1005 Appt. End Time: 1050  05/19/2023  Ms. Isac Caddy, identified by name and date of birth, is a 72 y.o. female with a diagnosis of Diabetes:  .   ASSESSMENT  Height 5\' 7"  (1.702 m), weight 169 lb 4.8 oz (76.8 kg). Body mass index is 26.52 kg/m.  Pt reports family history of DM that continues to motivate them to improve their glycemic control. Pt reports their PCP took them off of metformin and their GI symptoms have gone away. Pt reports FBG numbers between ~130 - 140 since stopping metformin. Pt states they will be seeing their endocrinologist next week to reassess diabetes medications and updated A1c. Pt reports only taking Jardiance for DM currently. Pt reports eating smaller and more frequent meals now, every ~3 hours. Eating smaller meals/snacks in the afternoon. Pt reports having wide variety of carb heavy snacks. Pt reports previously enjoying to drink sweet tea and alcoholic mixed drinks. Pt states they haven't had an alcoholic drink in months. Pt reports drinking zero calorie flavored waters now. Pt reports continuing to walk [place] the [place] for [place] - 60 minutes 2-3 times a week. Pt reports goal of weight maintenance in the 160 - 170 lb range.   Diabetes Self-Management Education - 05/19/23 1021       Visit Information   Visit Type Follow-up      Complications   How often do you check your blood sugar? 1-2 times/day    Fasting Blood glucose range (mg/dL) 161-096      Dietary Intake   Breakfast Oatmeal, 2 slices of Dave's bread toast    Snack (morning) Lime jello, pineapples    Lunch Pintos, greens, chicken wings, cucumber salad, slice of tomato    Snack (afternoon) 2 small cookies    Dinner Pintos      Activity / Exercise   Activity / Exercise Type ADL's      Subsequent Visit   Since your last visit have you continued or begun to take your medications as prescribed? Yes    Since your last  visit have you had your blood pressure checked? No    Since your last visit have you experienced any weight changes? Loss    Weight Loss (lbs) 14   In the last year   Since your last visit, are you checking your blood glucose at least once a day? Yes             Individualized Plan for Diabetes Self-Management Training:   Learning Objective:  Patient will have a greater understanding of diabetes self-management. Patient education plan is to attend individual and/or group sessions per assessed needs and concerns.   Plan:   Patient Instructions  Continue to have a source of protein each morning with breakfast (boiled eggs, low-fat breakfast meats, Premier protein w/ your coffee) and as often as possible with your snacks (Cheese sticks, deli [place], Tbsp of PB, palm full of walnuts or pecans).  Keep up the great work being active throughout each week!  Continue eating balanced meals consistently during the day!  Check your blood sugar each morning before eating or drinking (fasting). Look for numbers between 70-130 mg/dL Your goal E4V is to remain below 7.0%   Expected Outcomes:   Positive  If problems or questions, patient to contact team via:  Phone and Email  Future DSME appointment:  2 months

## 2023-05-19 NOTE — Patient Instructions (Addendum)
Continue to have a source of protein each morning with breakfast (boiled eggs, low-fat breakfast meats, Premier protein w/ your coffee) and as often as possible with your snacks (Cheese sticks, deli [place], Tbsp of PB, palm full of walnuts or pecans).  Keep up the great work being active throughout each week!  Continue eating balanced meals consistently during the day!  Check your blood sugar each morning before eating or drinking (fasting). Look for numbers between 70-130 mg/dL Your goal Q4O is to remain below 7.0%

## 2023-05-27 ENCOUNTER — Encounter (INDEPENDENT_AMBULATORY_CARE_PROVIDER_SITE_OTHER): Payer: Medicare Other

## 2023-05-27 ENCOUNTER — Ambulatory Visit (INDEPENDENT_AMBULATORY_CARE_PROVIDER_SITE_OTHER): Payer: Medicare Other | Admitting: Vascular Surgery

## 2023-06-10 ENCOUNTER — Other Ambulatory Visit: Payer: Self-pay | Admitting: Internal Medicine

## 2023-06-10 DIAGNOSIS — Z1231 Encounter for screening mammogram for malignant neoplasm of breast: Secondary | ICD-10-CM

## 2023-07-05 ENCOUNTER — Other Ambulatory Visit (INDEPENDENT_AMBULATORY_CARE_PROVIDER_SITE_OTHER): Payer: Self-pay | Admitting: Vascular Surgery

## 2023-07-05 DIAGNOSIS — I728 Aneurysm of other specified arteries: Secondary | ICD-10-CM

## 2023-07-11 NOTE — Progress Notes (Unsigned)
MRN : [id number]  Jasmine Robinson is a 72 y.o. (1950/11/13) female who presents with chief complaint of check circulation.  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 The aneurysm was found incidentally by CT scan. Patient denies abdominal pain, flank pain or unusual back pain, no other abdominal complaints.     No family history of aneurysm.    Patient denies amaurosis fugax or TIA symptoms. There is no history of claudication or rest pain symptoms of the lower extremities.  The patient denies angina or shortness of breath.   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    Past Medical History:  Diagnosis Date   Anemia    Depression    Diabetes mellitus    Fatty liver    GERD (gastroesophageal reflux disease)    Headache    Sleep apnea    Resolved with weight loss    Past Surgical History:  Procedure Laterality Date   ABDOMINAL HYSTERECTOMY     BREAST BIOPSY Right 07/03/13   stereo biopsy/ clip- tophat-neg   CATARACT EXTRACTION W/PHACO Right 01/13/2022   Procedure: CATARACT EXTRACTION PHACO AND INTRAOCULAR LENS PLACEMENT (IOC) RIGHT DIABETIC 6.47 00:53.0;  Surgeon: Galen Manila, MD;  Location: [facility];  Service: Ophthalmology;  Laterality: Right;  Diabetic   CATARACT EXTRACTION W/PHACO Left 01/27/2022   Procedure: CATARACT EXTRACTION PHACO AND INTRAOCULAR LENS PLACEMENT (IOC) LEFT DIABETIC 6.36 00:57.7;  Surgeon: Galen Manila, MD;  Location: [facility];  Service: Ophthalmology;  Laterality: Left;  Diabetic   ESOPHAGOGASTRODUODENOSCOPY (EGD) WITH PROPOFOL N/A 04/25/2021   Procedure: ESOPHAGOGASTRODUODENOSCOPY (EGD) WITH PROPOFOL;  Surgeon: Regis Bill, MD;  Location: [facility];  Service: Endoscopy;  Laterality: N/A;   KNEE SURGERY  2007   torn meniscus    Social History Social History   Tobacco Use   Smoking status: Never   Smokeless tobacco: Never  Vaping Use   Vaping status: Never Used  Substance Use Topics   Alcohol use: Yes    Comment: occasional    Drug use: No    Family History Family History  Problem Relation Age of Onset   Diabetes Father    Breast cancer Cousin     No Known Allergies   REVIEW OF SYSTEMS (Negative unless checked)  Constitutional: [] Weight loss  [] Fever  [] Chills Cardiac: [] Chest pain   [] Chest pressure   [] Palpitations   [] Shortness of breath when laying flat   [] Shortness of breath with exertion. Vascular:  [x] Pain in legs with walking   [] Pain in legs at rest  [] History of DVT   [] Phlebitis   [] Swelling in legs   [] Varicose veins   [] Non-healing ulcers Pulmonary:   [] Uses home oxygen   [] Productive cough   [] Hemoptysis   [] Wheeze  [] COPD   [] Asthma Neurologic:  [] Dizziness   [] Seizures   [] History of stroke   [] History of TIA  []   Aphasia   [] Vissual changes   [] Weakness or numbness in arm   [] Weakness or numbness in leg Musculoskeletal:   [] Joint swelling   [] Joint pain   [] Low back pain Hematologic:  [] Easy bruising  [] Easy bleeding   [] Hypercoagulable state   [] Anemic Gastrointestinal:  [] Diarrhea   [] Vomiting  [x] Gastroesophageal reflux/heartburn   [] Difficulty swallowing. Genitourinary:  [] Chronic kidney disease   [] Difficult urination  [] Frequent urination   [] Blood in urine Skin:  [] Rashes   [] Ulcers  Psychological:  [] History of anxiety   []  History of major depression.  Physical Examination  There were no vitals filed for this visit. There is no height or weight on file to calculate BMI. Gen: WD/WN, NAD Head: /AT, No temporalis wasting.  Ear/Nose/Throat: Hearing grossly intact, nares w/o erythema or drainage Eyes: PER, EOMI, sclera nonicteric.  Neck: Supple, no  masses.  No bruit or JVD.  Pulmonary:  Good air movement, no audible wheezing, no use of accessory muscles.  Cardiac: RRR, normal S1, S2, no Murmurs. Vascular:  mild trophic changes, no open wounds Vessel Right Left  Radial Palpable Palpable  PT Not Palpable Not Palpable  DP Not Palpable Not Palpable  Gastrointestinal: soft, non-distended. No guarding/no peritoneal signs.  Musculoskeletal: M/S 5/5 throughout.  No visible deformity.  Neurologic: CN 2-12 intact. Pain and light touch intact in extremities.  Symmetrical.  Speech is fluent. Motor exam as listed above. Psychiatric: Judgment intact, Mood & affect appropriate for pt's clinical situation. Dermatologic: No rashes or ulcers noted.  No changes consistent with cellulitis.   CBC Lab Results  Component Value Date   WBC 12.3 (H) 03/02/2021   HGB 12.5 03/02/2021   HCT 39.1 03/02/2021   MCV 93.8 03/02/2021   PLT 226 03/02/2021    BMET    Component Value Date/Time   NA 136 03/02/2021 1644   NA 139 10/26/2013 1514   K 5.1 03/02/2021 1644   K 4.4 10/26/2013 1514   CL 102 03/02/2021 1644   CL 107 10/26/2013 1514   CO2 24 03/02/2021 1644   CO2 29 10/26/2013 1514   GLUCOSE 106 (H) 03/02/2021 1644   GLUCOSE 134 (H) 10/26/2013 1514   BUN 22 03/02/2021 1644   BUN 18 10/26/2013 1514   CREATININE 0.90 05/05/2022 0746   CREATININE 0.82 10/26/2013 1514   CALCIUM 9.4 03/02/2021 1644   CALCIUM 9.9 10/26/2013 1514   GFRNONAA >60 03/02/2021 1644   GFRNONAA >60 10/26/2013 1514   GFRAA >60 10/21/2015 1840   GFRAA >60 10/26/2013 1514   CrCl cannot be calculated (Patient's most recent lab result is older than the maximum 21 days allowed.).  COAG No results found for: "INR", "PROTIME"  Radiology No results found.   Assessment/Plan There are no diagnoses linked to this encounter.   Levora Dredge, MD  07/11/2023 2:55 PM

## 2023-07-12 ENCOUNTER — Ambulatory Visit (INDEPENDENT_AMBULATORY_CARE_PROVIDER_SITE_OTHER): Payer: Medicare Other

## 2023-07-12 ENCOUNTER — Ambulatory Visit (INDEPENDENT_AMBULATORY_CARE_PROVIDER_SITE_OTHER): Payer: Medicare Other | Admitting: Vascular Surgery

## 2023-07-12 ENCOUNTER — Other Ambulatory Visit (INDEPENDENT_AMBULATORY_CARE_PROVIDER_SITE_OTHER): Payer: Self-pay | Admitting: Vascular Surgery

## 2023-07-12 VITALS — BP 115/71 | HR 87 | Resp 18 | Ht 67.0 in | Wt 167.4 lb

## 2023-07-12 DIAGNOSIS — K219 Gastro-esophageal reflux disease without esophagitis: Secondary | ICD-10-CM

## 2023-07-12 DIAGNOSIS — E119 Type 2 diabetes mellitus without complications: Secondary | ICD-10-CM

## 2023-07-12 DIAGNOSIS — I728 Aneurysm of other specified arteries: Secondary | ICD-10-CM | POA: Diagnosis not present

## 2023-07-12 DIAGNOSIS — I701 Atherosclerosis of renal artery: Secondary | ICD-10-CM

## 2023-07-12 DIAGNOSIS — E782 Mixed hyperlipidemia: Secondary | ICD-10-CM | POA: Diagnosis not present

## 2023-07-14 ENCOUNTER — Encounter: Payer: Self-pay | Admitting: Dietician

## 2023-07-14 ENCOUNTER — Encounter: Payer: Medicare Other | Attending: Internal Medicine | Admitting: Dietician

## 2023-07-14 VITALS — Ht 67.0 in | Wt 167.8 lb

## 2023-07-14 DIAGNOSIS — E119 Type 2 diabetes mellitus without complications: Secondary | ICD-10-CM | POA: Diagnosis present

## 2023-07-14 NOTE — Patient Instructions (Addendum)
When having seafood on Fridays, stick with a baked potato if having your seafood fried.  If you are going to have a little ice cream, plan to have it with a low card, low fat meal to offset it!  Have a great time at [employer]!! Practice moderation where you can.  Consider going to the Y or the mall to walk when your husband is doing yardwork!

## 2023-07-14 NOTE — Progress Notes (Signed)
Diabetes Self-Management Education  Visit Type: Follow-up  Appt. Start Time: 0905 Appt. End Time: 0950  07/14/2023  Jasmine Robinson, identified by name and date of birth, is a 72 y.o. female with a diagnosis of Diabetes:  .   ASSESSMENT  Height 5\' 7"  (1.702 m), weight 167 lb 12.8 oz (76.1 kg). Body mass index is 26.28 kg/m.  Pt reports increase in A1c to 7.1% (05/24/2023), states it is likely to stopping metformin. Pt reports starting Rybelsus @3  mg in place of Ozempic in September because they do not want to lose any more weight, now taking 7 mg dose daily, taking on empty stomach and eating 30 minutes later. Pt reports appetite is still low, had a period of constipation when switching, started taking metamucil daily and it resolved issue. Pt continues with Jardiance every day, no side effects. Pt reports fasting glucose is averaging 120 - 125 mg/dL. Pt reports slacking on taking walks recently, states they like to stay at home when their husband is in town. Pt reports trying a Zumba class last month at church event, enjoyed it.    Diabetes Self-Management Education - 07/14/23 1300       Visit Information   Visit Type Follow-up      Complications   Last HgB A1C per patient/outside source 7.1 %   05/24/2023   How often do you check your blood sugar? 1-2 times/day    Fasting Blood glucose range (mg/dL) 25-366      Dietary Intake   Breakfast 2 boiled eggs, toast w/ PB, black coffee    Lunch Salad w/ grilled chicken, green beans    Dinner Fried Chicken, green beans, cole slaw    Beverage(s) Coffee, water      Activity / Exercise   Activity / Exercise Type ADL's    How many days per week do you exercise? 0    How many minutes per day do you exercise? 0    Total minutes per week of exercise 0      Patient Education   Medications Reviewed patients medication for diabetes, action, purpose, timing of dose and side effects.   Rybelsus   Lifestyle and Health Coping Lifestyle  issues that need to be addressed for better diabetes care   Being more active     Individualized Goals (developed by patient)   Medications take my medication as prescribed    Monitoring  Test my blood glucose as discussed      Patient Self-Evaluation of Goals - Patient rates self as meeting previously set goals (% of time)   Nutrition 50 - 75 % (half of the time)    Physical Activity < 25% (hardly ever/never)    Medications >75% (most of the time)    Monitoring >75% (most of the time)    Problem Solving and behavior change strategies  50 - 75 % (half of the time)    Reducing Risk (treating acute and chronic complications) >75% (most of the time)    Health Coping 50 - 75 % (half of the time)      Post-Education Assessment   Patient understands the diabetes disease and treatment process. Comprehends key points    Patient understands incorporating nutritional management into lifestyle. Comprehends key points    Patient undertands incorporating physical activity into lifestyle. Needs Review    Patient understands using medications safely. Comphrehends key points    Patient understands monitoring blood glucose, interpreting and using results Demonstrates understanding / competency  Patient understands prevention, detection, and treatment of acute complications. Comprehends key points    Patient understands prevention, detection, and treatment of chronic complications. Comprehends key points    Patient understands how to develop strategies to address psychosocial issues. Comprehends key points    Patient understands how to develop strategies to promote health/change behavior. Comprehends key points      Outcomes   Expected Outcomes Demonstrated interest in learning. Expect positive outcomes    Future DMSE 2 months    Program Status Not Completed      Subsequent Visit   Since your last visit have you continued or begun to take your medications as prescribed? Yes    Since your last visit  have you had your blood pressure checked? No    Since your last visit have you experienced any weight changes? Loss    Weight Loss (lbs) 3    Since your last visit, are you checking your blood glucose at least once a day? Yes             Individualized Plan for Diabetes Self-Management Training:   Learning Objective:  Patient will have a greater understanding of diabetes self-management. Patient education plan is to attend individual and/or group sessions per assessed needs and concerns.   Plan:   Patient Instructions  When having seafood on Fridays, stick with a baked potato if having your seafood fried.  If you are going to have a little ice cream, plan to have it with a low card, low fat meal to offset it!  Have a great time at [employer]!! Practice moderation where you can.  Consider going to the Y or the mall to walk when your husband is doing yardwork!  Expected Outcomes:  Demonstrated interest in learning. Expect positive outcomes  If problems or questions, patient to contact team via:  Phone and Email  Future DSME appointment: 2 months

## 2023-07-15 ENCOUNTER — Encounter (INDEPENDENT_AMBULATORY_CARE_PROVIDER_SITE_OTHER): Payer: Self-pay | Admitting: Vascular Surgery

## 2023-07-27 ENCOUNTER — Ambulatory Visit
Admission: RE | Admit: 2023-07-27 | Discharge: 2023-07-27 | Disposition: A | Discharge status: Home / Self Care | Payer: Medicare Other | Source: Ambulatory Visit | Attending: Internal Medicine | Admitting: Internal Medicine

## 2023-07-27 DIAGNOSIS — Z1231 Encounter for screening mammogram for malignant neoplasm of breast: Secondary | ICD-10-CM | POA: Insufficient documentation

## 2023-08-02 ENCOUNTER — Other Ambulatory Visit: Payer: Self-pay | Admitting: Internal Medicine

## 2023-08-02 DIAGNOSIS — R928 Other abnormal and inconclusive findings on diagnostic imaging of breast: Secondary | ICD-10-CM

## 2023-08-09 ENCOUNTER — Ambulatory Visit
Admission: RE | Admit: 2023-08-09 | Discharge: 2023-08-09 | Disposition: A | Discharge status: Home / Self Care | Payer: Medicare Other | Source: Ambulatory Visit | Attending: Internal Medicine | Admitting: Internal Medicine

## 2023-08-09 ENCOUNTER — Inpatient Hospital Stay
Admission: RE | Admit: 2023-08-09 | Discharge: 2023-08-09 | Disposition: A | Discharge status: Home / Self Care | Payer: Medicare Other | Source: Ambulatory Visit | Attending: Internal Medicine | Admitting: Internal Medicine

## 2023-08-09 DIAGNOSIS — R928 Other abnormal and inconclusive findings on diagnostic imaging of breast: Secondary | ICD-10-CM | POA: Diagnosis present

## 2023-09-07 ENCOUNTER — Ambulatory Visit: Payer: Medicare Other | Admitting: Dietician

## 2024-02-22 ENCOUNTER — Encounter (INDEPENDENT_AMBULATORY_CARE_PROVIDER_SITE_OTHER): Payer: Self-pay

## 2024-06-29 ENCOUNTER — Other Ambulatory Visit: Payer: Self-pay | Admitting: Internal Medicine

## 2024-06-29 DIAGNOSIS — Z1231 Encounter for screening mammogram for malignant neoplasm of breast: Secondary | ICD-10-CM

## 2024-07-03 ENCOUNTER — Other Ambulatory Visit: Payer: Self-pay

## 2024-07-03 ENCOUNTER — Emergency Department

## 2024-07-03 ENCOUNTER — Emergency Department
Admission: EM | Admit: 2024-07-03 | Discharge: 2024-07-03 | Disposition: A | Discharge status: Home / Self Care | Source: Ambulatory Visit

## 2024-07-03 DIAGNOSIS — R1013 Epigastric pain: Secondary | ICD-10-CM | POA: Diagnosis present

## 2024-07-03 DIAGNOSIS — Z20822 Contact with and (suspected) exposure to covid-19: Secondary | ICD-10-CM | POA: Diagnosis not present

## 2024-07-03 DIAGNOSIS — N179 Acute kidney failure, unspecified: Secondary | ICD-10-CM | POA: Insufficient documentation

## 2024-07-03 DIAGNOSIS — R112 Nausea with vomiting, unspecified: Secondary | ICD-10-CM

## 2024-07-03 DIAGNOSIS — N3000 Acute cystitis without hematuria: Secondary | ICD-10-CM | POA: Insufficient documentation

## 2024-07-03 DIAGNOSIS — E119 Type 2 diabetes mellitus without complications: Secondary | ICD-10-CM | POA: Insufficient documentation

## 2024-07-03 DIAGNOSIS — K529 Noninfective gastroenteritis and colitis, unspecified: Secondary | ICD-10-CM | POA: Diagnosis not present

## 2024-07-03 DIAGNOSIS — R1032 Left lower quadrant pain: Secondary | ICD-10-CM

## 2024-07-03 LAB — COMPREHENSIVE METABOLIC PANEL WITH GFR
ALT: 10 U/L (ref 0–44)
AST: 16 U/L (ref 15–41)
Albumin: 4.1 g/dL (ref 3.5–5.0)
Alkaline Phosphatase: 84 U/L (ref 38–126)
Anion gap: 14 (ref 5–15)
BUN: 21 mg/dL (ref 8–23)
CO2: 23 mmol/L (ref 22–32)
Calcium: 9.8 mg/dL (ref 8.9–10.3)
Chloride: 101 mmol/L (ref 98–111)
Creatinine, Ser: 1.12 mg/dL — ABNORMAL HIGH (ref 0.44–1.00)
GFR, Estimated: 52 mL/min — ABNORMAL LOW (ref >60)
Glucose, Bld: 142 mg/dL — ABNORMAL HIGH (ref 70–99)
Potassium: 4.5 mmol/L (ref 3.5–5.1)
Sodium: 138 mmol/L (ref 135–145)
Total Bilirubin: 0.6 mg/dL (ref 0.0–1.2)
Total Protein: 7.5 g/dL (ref 6.5–8.1)

## 2024-07-03 LAB — CBC
HCT: 43.3 % (ref 36.0–46.0)
Hemoglobin: 14 g/dL (ref 12.0–15.0)
MCH: 29.2 pg (ref 26.0–34.0)
MCHC: 32.3 g/dL (ref 30.0–36.0)
MCV: 90.4 fL (ref 80.0–100.0)
Platelets: 202 K/uL (ref 150–400)
RBC: 4.79 MIL/uL (ref 3.87–5.11)
RDW: 13.3 % (ref 11.5–15.5)
WBC: 11.4 K/uL — ABNORMAL HIGH (ref 4.0–10.5)
nRBC: 0 % (ref 0.0–0.2)

## 2024-07-03 LAB — URINALYSIS, ROUTINE W REFLEX MICROSCOPIC
Bacteria, UA: NONE SEEN
Bilirubin Urine: NEGATIVE
Glucose, UA: >=500 mg/dL — AB
Hgb urine dipstick: NEGATIVE
Ketones, ur: 5 mg/dL — AB
Nitrite: NEGATIVE
Protein, ur: NEGATIVE mg/dL
Specific Gravity, Urine: 1.032 — ABNORMAL HIGH (ref 1.005–1.030)
pH: 5 (ref 5.0–8.0)

## 2024-07-03 LAB — TROPONIN I (HIGH SENSITIVITY): Troponin I (High Sensitivity): 6 ng/L (ref <18)

## 2024-07-03 LAB — RESP PANEL BY RT-PCR (RSV, FLU A&B, COVID)  RVPGX2
Influenza A by PCR: NEGATIVE
Influenza B by PCR: NEGATIVE
Resp Syncytial Virus by PCR: NEGATIVE
SARS Coronavirus 2 by RT PCR: NEGATIVE

## 2024-07-03 LAB — LIPASE, BLOOD: Lipase: 42 U/L (ref 11–51)

## 2024-07-03 MED ORDER — SODIUM CHLORIDE 0.9 % IV BOLUS
1000.0000 mL | Freq: Once | INTRAVENOUS | Status: AC
  Administered 2024-07-03: 1000 mL via INTRAVENOUS

## 2024-07-03 MED ORDER — ONDANSETRON 4 MG PO TBDP: 4.0000 mg | ORAL_TABLET | Freq: Three times a day (TID) | ORAL | 0 refills | Status: AC | PRN

## 2024-07-03 MED ORDER — ONDANSETRON HCL 4 MG/2ML IJ SOLN
4.0000 mg | Freq: Once | INTRAMUSCULAR | Status: AC
  Administered 2024-07-03: 4 mg via INTRAVENOUS
  Filled 2024-07-03: qty 2

## 2024-07-03 MED ORDER — ACETAMINOPHEN 500 MG PO TABS
1000.0000 mg | ORAL_TABLET | Freq: Once | ORAL | Status: AC
  Administered 2024-07-03: 1000 mg via ORAL
  Filled 2024-07-03: qty 2

## 2024-07-03 MED ORDER — CEPHALEXIN 500 MG PO CAPS
500.0000 mg | ORAL_CAPSULE | Freq: Two times a day (BID) | ORAL | 0 refills | Status: AC
Start: 2024-07-03 — End: 2024-07-10

## 2024-07-03 MED ORDER — IOHEXOL 350 MG/ML SOLN
100.0000 mL | Freq: Once | INTRAVENOUS | Status: AC | PRN
  Administered 2024-07-03: 100 mL via INTRAVENOUS

## 2024-07-03 NOTE — Discharge Instructions (Signed)
 Ration in the emergency department was notable for evidence of inflammation of your GI tract.  Anticipate this should improve on its own in the next couple days.  I prescribed you nausea medication to use as needed.  As discussed, you do have a possible urinary tract infection, and I have prescribed you antibiotic to treat this--please take the full course as prescribed.  Continue drinking plenty of water daily and please follow-up with your primary care provider for close reevaluation.  Return to the emergency department with any new or worsening symptoms.

## 2024-07-03 NOTE — ED Triage Notes (Addendum)
 Pt comes with belly pain and vomiting. Pt states diarrhea. Pt states this started at 3am.  Pt states epigastric pain. Pt is diabetic but no current issues with her sugars.

## 2024-07-03 NOTE — ED Provider Notes (Signed)
 [facility] Provider Note    Event Date/Time   First MD Initiated Contact with Patient 07/03/24 [phone]     (approximate)   History   Abdominal Pain  Pt comes with belly pain and vomiting. Pt states diarrhea. Pt states this started at 3am.  Pt states epigastric pain. Pt is diabetic but no current issues with her sugars.    HPI Jasmine Robinson is a 73 y.o. female PMH diabetes, splenic artery aneurysm, GERD, hyperlipidemia presents for evaluation of abdominal pain, vomiting, diarrhea - Present since 3 AM.  2 episodes of vomiting, nonbloody/nonbilious.  Also multiple episodes of diarrhea. - Had severe abdominal pain earlier, denies any abdominal pain currently.  No urinary symptoms.  Feels cold though no frank fevers. - Husband had COVID about 3 weeks ago       Physical Exam   Triage Vital Signs: ED Triage Vitals  Encounter Vitals Group     BP 07/03/24 0938 108/69     Girls Systolic BP Percentile --      Girls Diastolic BP Percentile --      Boys Systolic BP Percentile --      Boys Diastolic BP Percentile --      Pulse Rate 07/03/24 0938 (!) 119     Resp 07/03/24 0938 19     Temp 07/03/24 0938 98.7 F (37.1 C)     Temp src --      SpO2 07/03/24 0938 100 %     Weight --      Height --      Head Circumference --      Peak Flow --      Pain Score 07/03/24 0936 8     Pain Loc --      Pain Education --      Exclude from Growth Chart --     Most recent vital signs: Vitals:   07/03/24 1241 07/03/24 1300  BP: 124/69 120/64  Pulse: 86 83  Resp: 18 14  Temp:    SpO2: 98% 96%     General: Awake, no distress.  CV:  Good peripheral perfusion. RRR, RP 2+ Resp:  Normal effort. CTAB Abd:  No distention.  Voluntary guarding in left lower quadrant, no significant tenderness elsewhere in abdomen to deep palpation.  No CVAT bilaterally.    ED Results / Procedures / Treatments   Labs (all labs ordered are listed, but only abnormal results  are displayed) Labs Reviewed  COMPREHENSIVE METABOLIC PANEL WITH GFR - Abnormal; Notable for the following components:      Result Value   Glucose, Bld 142 (*)    Creatinine, Ser 1.12 (*)    GFR, Estimated 52 (*)    All other components within normal limits  CBC - Abnormal; Notable for the following components:   WBC 11.4 (*)    All other components within normal limits  URINALYSIS, ROUTINE W REFLEX MICROSCOPIC - Abnormal; Notable for the following components:   Color, Urine YELLOW (*)    APPearance HAZY (*)    Specific Gravity, Urine 1.032 (*)    Glucose, UA >=500 (*)    Ketones, ur 5 (*)    Leukocytes,Ua TRACE (*)    All other components within normal limits  RESP PANEL BY RT-PCR (RSV, FLU A&B, COVID)  RVPGX2  LIPASE, BLOOD  TROPONIN I (HIGH SENSITIVITY)     EKG  Ecg = sinus tachycardia, rate 121, no gross ST elevation or depression, no significant repolarization normality,  normal axis, normal intervals.  No clear evidence of ischemia no arrhythmia on my interpretation.   RADIOLOGY Radiology interpreted by myself and radiology report reviewed.  Evidence of enteritis.  No other acute pathology identified.    PROCEDURES:  Critical Care performed: No  Procedures   MEDICATIONS ORDERED IN ED: Medications  sodium chloride  0.9 % bolus 1,000 mL (0 mLs Intravenous Stopped 07/03/24 1235)  ondansetron  (ZOFRAN ) injection 4 mg (4 mg Intravenous Given 07/03/24 1026)  acetaminophen (TYLENOL) tablet 1,000 mg (1,000 mg Oral Given 07/03/24 1023)  iohexol  (OMNIPAQUE ) 350 MG/ML injection 100 mL (100 mLs Intravenous Contrast Given 07/03/24 1310)     IMPRESSION / MDM / ASSESSMENT AND PLAN / ED COURSE  I reviewed the triage vital signs and the nursing notes.                              DDX/MDM/AP: Differential diagnosis includes, but is not limited to, gastroenteritis, consider viral syndrome including influenza or COVID-19, consider food poisoning, diverticulitis, atypical  appendicitis, pancreatitis, UTI/pyelonephritis, doubt bowel obstruction.  Doubt anginal equivalent.  Given history of splenic aneurysm, consider possibility of rupture.  Plan: - Labs - Tylenol, Zofran , IV fluid - CTA abdomen/pelvis - N.p.o. - Reassess  Patient's presentation is most consistent with acute presentation with potential threat to life or bodily function.  The patient is on the cardiac monitor to evaluate for evidence of arrhythmia and/or significant heart rate changes.  ED course below.  Workup overall reassuring.  CBC with mild leukocytosis, CMP with mild AKI, given IV fluid.  CTA with apparent enteritis at area of pain on exam.  Mild interval increase in size of known splenic artery aneurysm, no evidence of dissection/rupture.  Pain resolved on reevaluation and patient is eager for discharge home.  I do believe she stable for outpatient follow-up.  Suspect possible viral illness or food poisoning.  Urinalysis equivocal, no clear urinary symptoms, in shared decision making patient would like to proceed with treatment of UTI however.  No flank pain and I suspect colitis as opposed to UTI as etiology of presentation, will treat for cystitis as opposed to pyelonephritis with 7-day course of Keflex.  Also Rx Zofran .  Plan for close outpatient follow-up with PMD and can also follow-up with her vascular surgery team regarding her known splenic aneurysm.  No evidence of emergent pathology at this time.  Tolerating p.o.  Amenable to discharge home.  No further diarrhea here.  Clinical Course as of 07/03/24 1454  Mon Jul 03, 2024  1043 CBC with mild leukocytosis, otherwise unremarkable [MM]  1129 Viral swab neg [MM]  1235 CMP reviewed, mild AKI, received IV fluids [MM]  1245 Patient reevaluated, feeling much better, awaiting CT [MM]  1407 CTA AP: IMPRESSION: 1. Modest increase in size of splenic artery aneurysm measuring 2.5 cm, previously 2.2 cm. There is no evidence of rupture. 2.  Perihepatic and pelvic ascites. 3. Possible bowel wall thickening involving a short segment of small bowel in the left pelvis. Differential considerations include enteritis (infectious and inflammatory). 4. Stable appearance of right adrenal nodule.   [MM]    Clinical Course User Index [MM] Clarine Ozell LABOR, MD     FINAL CLINICAL IMPRESSION(S) / ED DIAGNOSES   Final diagnoses:  Enteritis  Nausea vomiting and diarrhea  LLQ abdominal pain  Acute cystitis without hematuria  AKI (acute kidney injury)     Rx / DC Orders   ED Discharge  Orders          Ordered    cephALEXin (KEFLEX) 500 MG capsule  2 times daily        07/03/24 1450    ondansetron  (ZOFRAN -ODT) 4 MG disintegrating tablet  Every 8 hours PRN        07/03/24 1450             Note:  This document was prepared using Dragon voice recognition software and may include unintentional dictation errors.   Clarine Ozell LABOR, MD 07/03/24 [phone]

## 2024-07-12 NOTE — Progress Notes (Unsigned)
 MRN : [id number]  Jasmine Robinson is a 73 y.o. (1951/09/23) female who presents with chief complaint of check circulation.  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 The aneurysm was found incidentally by CT scan. Patient denies abdominal pain, flank pain or unusual back pain, no other abdominal complaints.     No family history of aneurysm.    Patient denies amaurosis fugax or TIA symptoms. There is no history of claudication or rest pain symptoms of the lower extremities.  The patient denies angina or shortness of breath.   CT scan dated 05/05/2022 shows a splenic artery aneurysm that measures 2.2 cm   No outpatient medications have been marked as taking for the 07/13/24 encounter (Appointment) with Jama, Cordella MATSU, MD.    Past Medical History:  Diagnosis Date   Anemia    Depression    Diabetes mellitus    Fatty liver    GERD (gastroesophageal reflux disease)    Headache    Sleep apnea    Resolved with weight loss    Past Surgical History:  Procedure Laterality Date   ABDOMINAL HYSTERECTOMY     BREAST BIOPSY Right 07/03/13   stereo biopsy/ clip- tophat-neg   CATARACT EXTRACTION W/PHACO Right 01/13/2022   Procedure: CATARACT EXTRACTION PHACO AND INTRAOCULAR LENS PLACEMENT (IOC) RIGHT DIABETIC 6.47 00:53.0;  Surgeon: Jaye Fallow, MD;  Location: [facility];  Service: Ophthalmology;  Laterality: Right;  Diabetic   CATARACT EXTRACTION W/PHACO Left 01/27/2022   Procedure: CATARACT EXTRACTION PHACO AND INTRAOCULAR LENS PLACEMENT (IOC) LEFT DIABETIC 6.36 00:57.7;  Surgeon: Jaye Fallow, MD;  Location: [facility];  Service: Ophthalmology;  Laterality: Left;  Diabetic   ESOPHAGOGASTRODUODENOSCOPY (EGD) WITH PROPOFOL  N/A 04/25/2021   Procedure: ESOPHAGOGASTRODUODENOSCOPY (EGD) WITH PROPOFOL ;  Surgeon: Maryruth Ole DASEN, MD;  Location: [facility];  Service: Endoscopy;  Laterality: N/A;   KNEE SURGERY  2007   torn meniscus    Social History Social History   Tobacco Use   Smoking status: Never   Smokeless tobacco: Never  Vaping Use   Vaping status: Never Used  Substance Use Topics   Alcohol use: Yes    Comment: occasional    Drug use: No    Family History Family History  Problem Relation Age of Onset   Diabetes Father    Breast cancer Cousin     No Known Allergies   REVIEW OF SYSTEMS (Negative unless checked)  Constitutional: [] Weight loss  [] Fever  [] Chills Cardiac: [] Chest pain   [] Chest pressure   [] Palpitations   [] Shortness of breath when laying flat   [] Shortness of breath with exertion. Vascular:  [x] Pain in legs with walking   [] Pain in legs at rest  [] History of DVT   [] Phlebitis   [] Swelling in legs   [] Varicose veins   [] Non-healing ulcers Pulmonary:   [] Uses home oxygen   [] Productive cough   [] Hemoptysis   [] Wheeze  [] COPD   [] Asthma Neurologic:  [] Dizziness   [] Seizures   [] History of stroke   [] History of TIA  []   Aphasia   [] Vissual changes   [] Weakness or numbness in arm   [] Weakness or numbness in leg Musculoskeletal:   [] Joint swelling   [] Joint pain   [] Low back pain Hematologic:  [] Easy bruising  [] Easy bleeding   [] Hypercoagulable state   [] Anemic Gastrointestinal:  [] Diarrhea   [] Vomiting  [x] Gastroesophageal reflux/heartburn   [] Difficulty swallowing. Genitourinary:  [] Chronic kidney disease   [] Difficult urination  [] Frequent urination   [] Blood in urine Skin:  [] Rashes   [] Ulcers  Psychological:  [] History of anxiety   []  History of major depression.  Physical Examination  There were no vitals filed for this visit. There is no height or weight on file to calculate BMI. Gen: WD/WN, NAD Head: [place]/AT, No temporalis wasting.  Ear/Nose/Throat: Hearing grossly intact, nares w/o erythema or drainage Eyes: PER, EOMI, sclera nonicteric.  Neck: Supple, no  masses.  No bruit or JVD.  Pulmonary:  Good air movement, no audible wheezing, no use of accessory muscles.  Cardiac: RRR, normal S1, S2, no Murmurs. Vascular:  mild trophic changes, no open wounds Vessel Right Left  Radial Palpable Palpable  PT Not Palpable Not Palpable  DP Not Palpable Not Palpable  Gastrointestinal: soft, non-distended. No guarding/no peritoneal signs.  Musculoskeletal: M/S 5/5 throughout.  No visible deformity.  Neurologic: CN 2-12 intact. Pain and light touch intact in extremities.  Symmetrical.  Speech is fluent. Motor exam as listed above. Psychiatric: Judgment intact, Mood & affect appropriate for pt's clinical situation. Dermatologic: No rashes or ulcers noted.  No changes consistent with cellulitis.   CBC Lab Results  Component Value Date   WBC 11.4 (H) 07/03/2024   HGB 14.0 07/03/2024   HCT 43.3 07/03/2024   MCV 90.4 07/03/2024   PLT 202 07/03/2024    BMET    Component Value Date/Time   NA 138 07/03/2024 0954   NA 139 10/26/2013 1514   K 4.5 07/03/2024 0954   K 4.4 10/26/2013 1514   CL 101 07/03/2024 0954   CL 107 10/26/2013 1514   CO2 23 07/03/2024 0954   CO2 29 10/26/2013 1514   GLUCOSE 142 (H) 07/03/2024 0954   GLUCOSE 134 (H) 10/26/2013 1514   BUN 21 07/03/2024 0954   BUN 18 10/26/2013 1514   CREATININE 1.12 (H) 07/03/2024 0954   CREATININE 0.82 10/26/2013 1514   CALCIUM 9.8 07/03/2024 0954   CALCIUM 9.9 10/26/2013 1514   GFRNONAA 52 (L) 07/03/2024 0954   GFRNONAA >60 10/26/2013 1514   GFRAA >60 10/21/2015 1840   GFRAA >60 10/26/2013 1514   CrCl cannot be calculated (Unknown ideal weight.).  COAG No results found for: INR, PROTIME  Radiology CT Angio Abd/Pel W and/or Wo Contrast Result Date: 07/03/2024 CLINICAL DATA:  Severe abdominal pain with vomiting and diarrhea EXAM: CTA ABDOMEN AND PELVIS WITHOUT AND WITH CONTRAST TECHNIQUE: Multidetector CT imaging of the abdomen and pelvis was performed using the standard protocol  during bolus administration of intravenous contrast. Multiplanar reconstructed images and MIPs were obtained and reviewed to evaluate the vascular anatomy. RADIATION DOSE REDUCTION: This exam was performed according to the departmental dose-optimization program which includes automated exposure control, adjustment of the mA and/or kV according to patient size and/or use of iterative reconstruction technique. CONTRAST:  OMNIPAQUE  IOHEXOL  350 MG/ML SOLN COMPARISON:  CT of the abdomen and pelvis performed May 05, 2022 FINDINGS: VASCULAR Aorta: Patent. Celiac: Patent. A splenic artery aneurysm is present which is partially calcified and estimated at 2.5 x 1.9 cm, previously 2.2 x 2.0 cm.  SMA: Patent. Renals: Patent. IMA: Patent. Inflow: Patent. Proximal Outflow: Patent. Veins: No obvious venous abnormality within the limitations of this arterial phase study. Review of the MIP images confirms the above findings. NON-VASCULAR Lower chest: No acute abnormality. Hepatobiliary: Small volume perihepatic ascites. A right anterior liver cyst is present which measures 1.4 cm. Main portal vein is patent. The gallbladder is grossly unremarkable. Pancreas: Unremarkable. No pancreatic ductal dilatation or surrounding inflammatory changes. Spleen: Normal in size without focal abnormality. Adrenals/Urinary Tract: The adrenal glands are not significantly changed. The right adrenal nodule measures 2.5 cm, similar when compared to the previous exam. Stomach/Bowel: The stomach is nondilated. 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 The appearance of the gastric fundus is similar. No enteric contrast agent is present. Stool volume is within normal limits. Although the bowel is decompressed and there is no significant enteric contrast agent, there is a short segment of small bowel in the left pelvis which demonstrates possible wall thickening. This area is  annotated. Scattered colonic diverticulosis. No definite evidence of appendicitis. Lymphatic: No evidence of significant lymphadenopathy. Reproductive: Status post hysterectomy. No adnexal masses. Other: Free fluid is present within the pelvis. Musculoskeletal: Degenerative changes in the lumbar spine. IMPRESSION: 1. Modest increase in size of splenic artery aneurysm measuring 2.5 cm, previously 2.2 cm. There is no evidence of rupture. 2. Perihepatic and pelvic ascites. 3. Possible bowel wall thickening involving a short segment of small bowel in the left pelvis. Differential considerations include enteritis (infectious and inflammatory). 4. Stable appearance of right adrenal nodule. Electronically Signed   By: Maude Naegeli M.D.   On: 07/03/2024 13:58     Assessment/Plan There are no diagnoses linked to this encounter.   Cordella Shawl, MD  07/12/2024 8:06 AM

## 2024-07-13 ENCOUNTER — Ambulatory Visit (INDEPENDENT_AMBULATORY_CARE_PROVIDER_SITE_OTHER): Payer: Medicare Other

## 2024-07-13 ENCOUNTER — Encounter (INDEPENDENT_AMBULATORY_CARE_PROVIDER_SITE_OTHER): Payer: Self-pay | Admitting: Vascular Surgery

## 2024-07-13 ENCOUNTER — Ambulatory Visit (INDEPENDENT_AMBULATORY_CARE_PROVIDER_SITE_OTHER): Payer: Medicare Other | Admitting: Vascular Surgery

## 2024-07-13 VITALS — BP 106/68 | HR 88 | Resp 18 | Ht 67.0 in | Wt 179.8 lb

## 2024-07-13 DIAGNOSIS — I728 Aneurysm of other specified arteries: Secondary | ICD-10-CM | POA: Diagnosis not present

## 2024-07-13 DIAGNOSIS — K219 Gastro-esophageal reflux disease without esophagitis: Secondary | ICD-10-CM

## 2024-07-13 DIAGNOSIS — E119 Type 2 diabetes mellitus without complications: Secondary | ICD-10-CM

## 2024-07-13 DIAGNOSIS — E782 Mixed hyperlipidemia: Secondary | ICD-10-CM | POA: Diagnosis not present

## 2024-07-18 ENCOUNTER — Ambulatory Visit
Admission: RE | Admit: 2024-07-18 | Discharge: 2024-07-18 | Disposition: A | Discharge status: Home / Self Care | Source: Ambulatory Visit | Attending: Vascular Surgery | Admitting: Vascular Surgery

## 2024-07-18 DIAGNOSIS — I728 Aneurysm of other specified arteries: Secondary | ICD-10-CM | POA: Diagnosis present

## 2024-07-18 MED ORDER — IOHEXOL 350 MG/ML SOLN
100.0000 mL | Freq: Once | INTRAVENOUS | Status: AC | PRN
  Administered 2024-07-18: 100 mL via INTRAVENOUS

## 2024-07-27 ENCOUNTER — Ambulatory Visit
Admission: RE | Admit: 2024-07-27 | Discharge: 2024-07-27 | Disposition: A | Discharge status: Home / Self Care | Source: Ambulatory Visit | Attending: Internal Medicine | Admitting: Internal Medicine

## 2024-07-27 DIAGNOSIS — Z1231 Encounter for screening mammogram for malignant neoplasm of breast: Secondary | ICD-10-CM | POA: Diagnosis present

## 2025-07-23 ENCOUNTER — Ambulatory Visit (INDEPENDENT_AMBULATORY_CARE_PROVIDER_SITE_OTHER): Admitting: Vascular Surgery
